# Patient Record
Sex: Female | Born: 1990 | Race: White | Hispanic: No | Marital: Single | Smoking: Former smoker
Health system: Southern US, Community
[De-identification: ages and names within clinical notes are randomized; demographics above are authoritative.]

## PROBLEM LIST (undated history)

## (undated) DIAGNOSIS — N730 Acute parametritis and pelvic cellulitis: Secondary | ICD-10-CM

## (undated) DIAGNOSIS — Z22322 Carrier or suspected carrier of Methicillin resistant Staphylococcus aureus: Secondary | ICD-10-CM

## (undated) DIAGNOSIS — N83209 Unspecified ovarian cyst, unspecified side: Secondary | ICD-10-CM

## (undated) DIAGNOSIS — R102 Pelvic and perineal pain unspecified side: Secondary | ICD-10-CM

## (undated) DIAGNOSIS — F1110 Opioid abuse, uncomplicated: Secondary | ICD-10-CM

## (undated) DIAGNOSIS — G43909 Migraine, unspecified, not intractable, without status migrainosus: Secondary | ICD-10-CM

## (undated) DIAGNOSIS — R569 Unspecified convulsions: Secondary | ICD-10-CM

## (undated) DIAGNOSIS — N200 Calculus of kidney: Secondary | ICD-10-CM

## (undated) DIAGNOSIS — R161 Splenomegaly, not elsewhere classified: Secondary | ICD-10-CM

## (undated) DIAGNOSIS — G8929 Other chronic pain: Secondary | ICD-10-CM

---
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
## 2008-02-17 ENCOUNTER — Emergency Department (HOSPITAL_COMMUNITY): Admission: EM | Admit: 2008-02-17 | Discharge: 2008-02-17 | Payer: Self-pay | Admitting: Emergency Medicine

## 2008-09-10 ENCOUNTER — Emergency Department (HOSPITAL_COMMUNITY): Admission: EM | Admit: 2008-09-10 | Discharge: 2008-09-10 | Payer: Self-pay | Admitting: Emergency Medicine

## 2008-09-13 ENCOUNTER — Emergency Department (HOSPITAL_COMMUNITY): Admission: EM | Admit: 2008-09-13 | Discharge: 2008-09-13 | Payer: Self-pay | Admitting: Emergency Medicine

## 2008-10-07 ENCOUNTER — Emergency Department (HOSPITAL_COMMUNITY): Admission: EM | Admit: 2008-10-07 | Discharge: 2008-10-07 | Payer: Self-pay | Admitting: Emergency Medicine

## 2009-02-23 ENCOUNTER — Emergency Department (HOSPITAL_COMMUNITY): Admission: EM | Admit: 2009-02-23 | Discharge: 2009-02-23 | Payer: Self-pay | Admitting: Emergency Medicine

## 2009-05-08 ENCOUNTER — Emergency Department (HOSPITAL_COMMUNITY): Admission: EM | Admit: 2009-05-08 | Discharge: 2009-05-08 | Payer: Self-pay | Admitting: Emergency Medicine

## 2009-09-22 ENCOUNTER — Emergency Department (HOSPITAL_COMMUNITY): Admission: EM | Admit: 2009-09-22 | Discharge: 2009-09-22 | Payer: Self-pay | Admitting: Emergency Medicine

## 2009-10-12 ENCOUNTER — Ambulatory Visit (HOSPITAL_COMMUNITY): Admission: RE | Admit: 2009-10-12 | Discharge: 2009-10-12 | Payer: Self-pay | Admitting: General Surgery

## 2009-11-30 ENCOUNTER — Emergency Department (HOSPITAL_COMMUNITY)
Admission: EM | Admit: 2009-11-30 | Discharge: 2009-11-30 | Payer: Self-pay | Source: Home / Self Care | Admitting: Emergency Medicine

## 2010-01-22 ENCOUNTER — Emergency Department (HOSPITAL_COMMUNITY)
Admission: EM | Admit: 2010-01-22 | Discharge: 2010-01-22 | Payer: Self-pay | Source: Home / Self Care | Admitting: Emergency Medicine

## 2010-02-24 ENCOUNTER — Encounter: Payer: Self-pay | Admitting: Preventative Medicine

## 2010-02-25 ENCOUNTER — Encounter: Payer: Self-pay | Admitting: Neurology

## 2010-02-26 ENCOUNTER — Encounter: Payer: Self-pay | Admitting: Family Medicine

## 2010-03-19 ENCOUNTER — Emergency Department (HOSPITAL_COMMUNITY)
Admission: EM | Admit: 2010-03-19 | Discharge: 2010-03-19 | Disposition: A | Discharge status: Home / Self Care | Payer: PRIVATE HEALTH INSURANCE | Attending: Emergency Medicine | Admitting: Emergency Medicine

## 2010-03-19 DIAGNOSIS — G40909 Epilepsy, unspecified, not intractable, without status epilepticus: Secondary | ICD-10-CM | POA: Insufficient documentation

## 2010-03-19 DIAGNOSIS — F411 Generalized anxiety disorder: Secondary | ICD-10-CM | POA: Insufficient documentation

## 2010-03-19 LAB — DIFFERENTIAL
Basophils Absolute: 0 10*3/uL (ref 0.0–0.1)
Basophils Relative: 0 % (ref 0–1)
Eosinophils Absolute: 0.1 10*3/uL (ref 0.0–0.7)
Eosinophils Relative: 1 % (ref 0–5)
Lymphocytes Relative: 30 % (ref 12–46)
Lymphs Abs: 2.7 10*3/uL (ref 0.7–4.0)
Monocytes Absolute: 0.6 10*3/uL (ref 0.1–1.0)
Monocytes Relative: 7 % (ref 3–12)
Neutro Abs: 5.6 10*3/uL (ref 1.7–7.7)

## 2010-03-19 LAB — VALPROIC ACID LEVEL: Valproic Acid Lvl: <10 ug/mL — ABNORMAL LOW (ref 50.0–100.0)

## 2010-03-19 LAB — CBC
HCT: 37.9 % (ref 36.0–46.0)
Hemoglobin: 13.4 g/dL (ref 12.0–15.0)
MCHC: 35.4 g/dL (ref 30.0–36.0)
MCV: 89.4 fL (ref 78.0–100.0)
RBC: 4.24 MIL/uL (ref 3.87–5.11)
RDW: 12.2 % (ref 11.5–15.5)
WBC: 9 10*3/uL (ref 4.0–10.5)

## 2010-03-19 LAB — BASIC METABOLIC PANEL
BUN: 10 mg/dL (ref 6–23)
CO2: 26 mEq/L (ref 19–32)
Calcium: 9.7 mg/dL (ref 8.4–10.5)
Chloride: 107 mEq/L (ref 96–112)
GFR calc non Af Amer: >60 mL/min (ref >60)
Glucose, Bld: 93 mg/dL (ref 70–99)
Potassium: 3.9 mEq/L (ref 3.5–5.1)
Sodium: 138 mEq/L (ref 135–145)

## 2010-04-08 ENCOUNTER — Emergency Department (HOSPITAL_COMMUNITY)
Admission: EM | Admit: 2010-04-08 | Discharge: 2010-04-08 | Disposition: A | Discharge status: Home / Self Care | Payer: PRIVATE HEALTH INSURANCE | Attending: Emergency Medicine | Admitting: Emergency Medicine

## 2010-04-08 DIAGNOSIS — R569 Unspecified convulsions: Secondary | ICD-10-CM | POA: Insufficient documentation

## 2010-04-08 DIAGNOSIS — Z765 Malingerer [conscious simulation]: Secondary | ICD-10-CM | POA: Insufficient documentation

## 2010-04-08 DIAGNOSIS — Z79899 Other long term (current) drug therapy: Secondary | ICD-10-CM | POA: Insufficient documentation

## 2010-04-08 DIAGNOSIS — F319 Bipolar disorder, unspecified: Secondary | ICD-10-CM | POA: Insufficient documentation

## 2010-04-08 LAB — RAPID URINE DRUG SCREEN, HOSP PERFORMED
Amphetamines: NOT DETECTED
Barbiturates: NOT DETECTED
Benzodiazepines: NOT DETECTED
Cocaine: NOT DETECTED
Opiates: NOT DETECTED
Tetrahydrocannabinol: NOT DETECTED

## 2010-04-08 LAB — URINALYSIS, ROUTINE W REFLEX MICROSCOPIC
Bilirubin Urine: NEGATIVE
Hgb urine dipstick: NEGATIVE
Ketones, ur: NEGATIVE mg/dL
Nitrite: NEGATIVE
Protein, ur: NEGATIVE mg/dL
Specific Gravity, Urine: 1.02 (ref 1.005–1.030)
Urobilinogen, UA: 0.2 mg/dL (ref 0.0–1.0)

## 2010-04-08 LAB — POCT I-STAT, CHEM 8
BUN: 9 mg/dL (ref 6–23)
Calcium, Ion: 1.04 mmol/L — ABNORMAL LOW (ref 1.12–1.32)
Chloride: 108 mEq/L (ref 96–112)
Creatinine, Ser: 0.7 mg/dL (ref 0.4–1.2)
Glucose, Bld: 90 mg/dL (ref 70–99)
HCT: 42 % (ref 36.0–46.0)
Potassium: 4.2 mEq/L (ref 3.5–5.1)

## 2010-04-08 LAB — ETHANOL: Alcohol, Ethyl (B): <5 mg/dL (ref 0–10)

## 2010-04-19 LAB — BASIC METABOLIC PANEL
BUN: 8 mg/dL (ref 6–23)
CO2: 28 mEq/L (ref 19–32)
Calcium: 10 mg/dL (ref 8.4–10.5)
Chloride: 101 mEq/L (ref 96–112)
Creatinine, Ser: 0.57 mg/dL (ref 0.4–1.2)
GFR calc Af Amer: >60 mL/min (ref >60)
GFR calc non Af Amer: >60 mL/min (ref >60)
Glucose, Bld: 83 mg/dL (ref 70–99)
Potassium: 4.1 mEq/L (ref 3.5–5.1)
Sodium: 137 mEq/L (ref 135–145)

## 2010-04-19 LAB — CBC
HCT: 42.3 % (ref 36.0–46.0)
Hemoglobin: 14.4 g/dL (ref 12.0–15.0)
MCH: 31.3 pg (ref 26.0–34.0)
MCHC: 34.2 g/dL (ref 30.0–36.0)
MCV: 91.7 fL (ref 78.0–100.0)
Platelets: 296 10*3/uL (ref 150–400)
RBC: 4.61 MIL/uL (ref 3.87–5.11)
RDW: 13.1 % (ref 11.5–15.5)
WBC: 10 10*3/uL (ref 4.0–10.5)

## 2010-04-19 LAB — SURGICAL PCR SCREEN: Staphylococcus aureus: POSITIVE — AB

## 2010-04-19 LAB — HCG, QUANTITATIVE, PREGNANCY: hCG, Beta Chain, Quant, S: <2 m[IU]/mL (ref <5)

## 2010-04-25 LAB — DIFFERENTIAL
Basophils Absolute: 0 10*3/uL (ref 0.0–0.1)
Basophils Relative: 0 % (ref 0–1)
Eosinophils Absolute: 0.1 10*3/uL (ref 0.0–0.7)
Monocytes Absolute: 0.5 10*3/uL (ref 0.1–1.0)
Neutro Abs: 6.7 10*3/uL (ref 1.7–7.7)

## 2010-04-25 LAB — COMPREHENSIVE METABOLIC PANEL
ALT: 18 U/L (ref 0–35)
Albumin: 4 g/dL (ref 3.5–5.2)
Alkaline Phosphatase: 75 U/L (ref 39–117)
BUN: 8 mg/dL (ref 6–23)
Chloride: 104 mEq/L (ref 96–112)
Glucose, Bld: 100 mg/dL — ABNORMAL HIGH (ref 70–99)
Potassium: 3 mEq/L — ABNORMAL LOW (ref 3.5–5.1)
Sodium: 139 mEq/L (ref 135–145)
Total Bilirubin: 0.8 mg/dL (ref 0.3–1.2)

## 2010-04-25 LAB — URINALYSIS, ROUTINE W REFLEX MICROSCOPIC
Nitrite: NEGATIVE
Specific Gravity, Urine: 1.015 (ref 1.005–1.030)
Urobilinogen, UA: 0.2 mg/dL (ref 0.0–1.0)

## 2010-04-25 LAB — CBC
HCT: 41.2 % (ref 36.0–46.0)
Hemoglobin: 14.4 g/dL (ref 12.0–15.0)
Platelets: 257 10*3/uL (ref 150–400)
WBC: 9.3 10*3/uL (ref 4.0–10.5)

## 2010-04-25 LAB — RAPID URINE DRUG SCREEN, HOSP PERFORMED
Barbiturates: NOT DETECTED
Opiates: NOT DETECTED

## 2010-04-25 LAB — ETHANOL: Alcohol, Ethyl (B): <5 mg/dL (ref 0–10)

## 2010-04-25 LAB — VALPROIC ACID LEVEL: Valproic Acid Lvl: 23 ug/mL — ABNORMAL LOW (ref 50.0–100.0)

## 2010-05-11 LAB — URINALYSIS, ROUTINE W REFLEX MICROSCOPIC
Glucose, UA: NEGATIVE mg/dL
Hgb urine dipstick: NEGATIVE
Protein, ur: NEGATIVE mg/dL
Specific Gravity, Urine: 1.01 (ref 1.005–1.030)
pH: 7 (ref 5.0–8.0)

## 2010-05-12 LAB — URINE MICROSCOPIC-ADD ON

## 2010-05-12 LAB — URINALYSIS, ROUTINE W REFLEX MICROSCOPIC
Glucose, UA: NEGATIVE mg/dL
Glucose, UA: NEGATIVE mg/dL
Hgb urine dipstick: NEGATIVE
Leukocytes, UA: NEGATIVE
Nitrite: NEGATIVE
Specific Gravity, Urine: >1.03 — ABNORMAL HIGH (ref 1.005–1.030)
pH: 5.5 (ref 5.0–8.0)
pH: 6 (ref 5.0–8.0)

## 2010-05-12 LAB — DIFFERENTIAL
Basophils Relative: 0 % (ref 0–1)
Eosinophils Absolute: 0.2 10*3/uL (ref 0.0–0.7)
Eosinophils Relative: 2 % (ref 0–5)
Eosinophils Relative: 2 % (ref 0–5)
Lymphocytes Relative: 20 % (ref 12–46)
Lymphocytes Relative: 24 % (ref 12–46)
Lymphs Abs: 2.2 10*3/uL (ref 0.7–4.0)
Monocytes Relative: 5 % (ref 3–12)
Monocytes Relative: 8 % (ref 3–12)
Neutro Abs: 4.4 10*3/uL (ref 1.7–7.7)
Neutrophils Relative %: 69 % (ref 43–77)

## 2010-05-12 LAB — COMPREHENSIVE METABOLIC PANEL
ALT: 12 U/L (ref 0–35)
AST: 16 U/L (ref 0–37)
Albumin: 3.9 g/dL (ref 3.5–5.2)
Alkaline Phosphatase: 55 U/L (ref 39–117)
BUN: 8 mg/dL (ref 6–23)
GFR calc Af Amer: >60 mL/min (ref >60)
Potassium: 3.5 mEq/L (ref 3.5–5.1)
Sodium: 140 mEq/L (ref 135–145)
Total Protein: 6.8 g/dL (ref 6.0–8.3)

## 2010-05-12 LAB — CBC
HCT: 40.1 % (ref 36.0–46.0)
MCV: 89.6 fL (ref 78.0–100.0)
Platelets: 184 10*3/uL (ref 150–400)
RBC: 4.48 MIL/uL (ref 3.87–5.11)
RDW: 12.6 % (ref 11.5–15.5)
WBC: 6.2 10*3/uL (ref 4.0–10.5)
WBC: 8.9 10*3/uL (ref 4.0–10.5)

## 2010-05-12 LAB — GC/CHLAMYDIA PROBE AMP, GENITAL: Chlamydia, DNA Probe: NEGATIVE

## 2010-05-12 LAB — POCT I-STAT, CHEM 8
BUN: 6 mg/dL (ref 6–23)
Creatinine, Ser: 0.7 mg/dL (ref 0.4–1.2)
Glucose, Bld: 87 mg/dL (ref 70–99)
Potassium: 3.6 mEq/L (ref 3.5–5.1)
Sodium: 141 mEq/L (ref 135–145)

## 2010-05-12 LAB — WET PREP, GENITAL

## 2010-05-21 LAB — URINALYSIS, ROUTINE W REFLEX MICROSCOPIC
Glucose, UA: NEGATIVE mg/dL
Leukocytes, UA: NEGATIVE
Nitrite: NEGATIVE
Specific Gravity, Urine: 1.01 (ref 1.005–1.030)
pH: >9 — ABNORMAL HIGH (ref 5.0–8.0)

## 2010-05-21 LAB — URINE MICROSCOPIC-ADD ON
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
## 2010-07-17 ENCOUNTER — Emergency Department (HOSPITAL_COMMUNITY)
Admission: EM | Admit: 2010-07-17 | Discharge: 2010-07-17 | Disposition: A | Discharge status: Home / Self Care | Payer: Medicaid Other | Attending: Emergency Medicine | Admitting: Emergency Medicine

## 2010-07-17 DIAGNOSIS — R21 Rash and other nonspecific skin eruption: Secondary | ICD-10-CM | POA: Insufficient documentation

## 2010-07-17 DIAGNOSIS — M722 Plantar fascial fibromatosis: Secondary | ICD-10-CM | POA: Insufficient documentation

## 2010-07-17 DIAGNOSIS — M79609 Pain in unspecified limb: Secondary | ICD-10-CM | POA: Insufficient documentation

## 2010-08-07 ENCOUNTER — Emergency Department (HOSPITAL_COMMUNITY)
Admission: EM | Admit: 2010-08-07 | Discharge: 2010-08-07 | Disposition: A | Discharge status: Home / Self Care | Payer: Medicaid Other | Attending: Emergency Medicine | Admitting: Emergency Medicine

## 2010-08-07 DIAGNOSIS — F17200 Nicotine dependence, unspecified, uncomplicated: Secondary | ICD-10-CM | POA: Insufficient documentation

## 2010-08-07 DIAGNOSIS — F1911 Other psychoactive substance abuse, in remission: Secondary | ICD-10-CM | POA: Insufficient documentation

## 2010-08-07 DIAGNOSIS — L0100 Impetigo, unspecified: Secondary | ICD-10-CM | POA: Insufficient documentation

## 2011-03-15 ENCOUNTER — Emergency Department (HOSPITAL_COMMUNITY): Payer: Self-pay

## 2011-03-15 ENCOUNTER — Encounter (HOSPITAL_COMMUNITY): Payer: Self-pay

## 2011-03-15 ENCOUNTER — Emergency Department (HOSPITAL_COMMUNITY)
Admission: EM | Admit: 2011-03-15 | Discharge: 2011-03-16 | Disposition: A | Discharge status: Home / Self Care | Payer: Self-pay | Attending: Emergency Medicine | Admitting: Emergency Medicine

## 2011-03-15 DIAGNOSIS — F17200 Nicotine dependence, unspecified, uncomplicated: Secondary | ICD-10-CM | POA: Insufficient documentation

## 2011-03-15 DIAGNOSIS — R350 Frequency of micturition: Secondary | ICD-10-CM | POA: Insufficient documentation

## 2011-03-15 DIAGNOSIS — R109 Unspecified abdominal pain: Secondary | ICD-10-CM | POA: Insufficient documentation

## 2011-03-15 HISTORY — DX: Migraine, unspecified, not intractable, without status migrainosus: G43.909

## 2011-03-15 LAB — URINALYSIS, ROUTINE W REFLEX MICROSCOPIC
Bilirubin Urine: NEGATIVE
Hgb urine dipstick: NEGATIVE
Ketones, ur: NEGATIVE mg/dL
Nitrite: NEGATIVE
Urobilinogen, UA: 0.2 mg/dL (ref 0.0–1.0)

## 2011-03-15 MED ORDER — HYDROMORPHONE HCL PF 1 MG/ML IJ SOLN
1.0000 mg | Freq: Once | INTRAMUSCULAR | Status: AC
  Administered 2011-03-15: 1 mg via INTRAVENOUS
  Filled 2011-03-15: qty 1

## 2011-03-15 MED ORDER — ONDANSETRON HCL 4 MG/2ML IJ SOLN
4.0000 mg | Freq: Once | INTRAMUSCULAR | Status: AC
  Administered 2011-03-15: 4 mg via INTRAVENOUS
  Filled 2011-03-15: qty 2

## 2011-03-15 MED ORDER — KETOROLAC TROMETHAMINE 30 MG/ML IJ SOLN
30.0000 mg | Freq: Once | INTRAMUSCULAR | Status: AC
  Administered 2011-03-15: 30 mg via INTRAVENOUS
  Filled 2011-03-15: qty 1

## 2011-03-15 MED ORDER — SODIUM CHLORIDE 0.9 % IV SOLN
Freq: Once | INTRAVENOUS | Status: AC
  Administered 2011-03-15: 23:00:00 via INTRAVENOUS

## 2011-03-15 NOTE — ED Notes (Signed)
Pt presents with right sided flank pain that radiates to right side of abdomen. Pt also reports increased urinary frequency.

## 2011-03-15 NOTE — ED Provider Notes (Signed)
This chart was scribed for [employer]. Colon Branch, MD by Williemae Natter. The patient was seen in room [facility]07/[facility]07 at 10:15 PM .  CSN: [id number]  Arrival date & time 03/15/11  2126   First MD Initiated Contact with Patient 03/15/11 2141      Chief Complaint  Patient presents with  . Flank Pain  . Urinary Frequency    (Consider location/radiation/quality/duration/timing/severity/associated sxs/prior treatment) Patient is a 21 y.o. female presenting with flank pain. The history is provided by the patient.  Flank Pain This is a new problem. The current episode started more than 1 week ago. The problem occurs constantly. The problem has been gradually worsening. The symptoms are aggravated by nothing. The symptoms are relieved by lying down.   [name] is a 21 y.o. female who presents to the Emergency Department complaining of severe acute onset right flank pain for the past 1 week. Pt reports urinary urgency and sharp abdominal pain that radiates to her lower back. Pt denies any fever, vomiting, chills, or blood in urine.  Past Medical History  Diagnosis Date  . Migraines     History reviewed. No pertinent past surgical history.  No family history on file.  History  Substance Use Topics  . Smoking status: Current Everyday Smoker -- 0.5 packs/day  . Smokeless tobacco: Not on file  . Alcohol Use: Yes     occ    OB History    Grav Para Term Preterm Abortions TAB SAB Ect Mult Living                  Review of Systems  Genitourinary: Positive for flank pain.  10 Systems reviewed and are negative for acute change except as noted in the HPI.   Allergies  Red dye  Home Medications   Current Outpatient Rx  Name Route Sig Dispense Refill  . BC HEADACHE POWDER PO Oral Take 1 packet by mouth 3 (three) times daily as needed. For pain      BP 123/66  Pulse 95  Temp(Src) 98.9 F (37.2 C) (Oral)  Resp 20  Ht 5\' 3"  (1.6 m)  Wt 150 lb (68.04 kg)  BMI 26.57 kg/m2  SpO2  100%  LMP 02/19/2011  Physical Exam  Nursing note and vitals reviewed. Constitutional: She appears well-developed and well-nourished.  HENT:  Head: Normocephalic and atraumatic.  Neck: Normal range of motion. Neck supple.  Cardiovascular: Normal rate, regular rhythm and normal heart sounds.   Pulmonary/Chest: Effort normal and breath sounds normal.  Abdominal: There is CVA tenderness (right CVA tenderness with palpation).  Neurological: She is alert.  Skin: Skin is warm and dry.  Psychiatric: She has a normal mood and affect. Her behavior is normal.    ED Course  Procedures (including critical care time) DIAGNOSTIC STUDIES: Oxygen Saturation is 100% on room air, normal by my interpretation.    COORDINATION OF CARE:  Medications  Aspirin-Salicylamide-Caffeine (BC HEADACHE POWDER PO) (not administered)   Results for orders placed during the hospital encounter of 03/15/11  URINALYSIS, ROUTINE W REFLEX MICROSCOPIC      Component Value Range   Color, Urine YELLOW  YELLOW    APPearance CLEAR  CLEAR    Specific Gravity, Urine 1.020  1.005 - 1.030    pH 6.0  5.0 - 8.0    Glucose, UA NEGATIVE  NEGATIVE (mg/dL)   Hgb urine dipstick NEGATIVE  NEGATIVE    Bilirubin Urine NEGATIVE  NEGATIVE    Ketones, ur NEGATIVE  NEGATIVE (mg/dL)   Protein, ur NEGATIVE  NEGATIVE (mg/dL)   Urobilinogen, UA 0.2  0.0 - 1.0 (mg/dL)   Nitrite NEGATIVE  NEGATIVE    Leukocytes, UA NEGATIVE  NEGATIVE   POCT PREGNANCY, URINE      Component Value Range   Preg Test, Ur NEGATIVE  NEGATIVE     Ct Abdomen Pelvis Wo Contrast  03/15/2011  *RADIOLOGY REPORT*  Clinical Data: Right flank pain since last night.  CT ABDOMEN AND PELVIS WITHOUT CONTRAST  Technique:  Multidetector CT imaging of the abdomen and pelvis was performed following the standard protocol without intravenous contrast.  Comparison: 09/10/2008  Findings: The lung bases are clear.  The kidneys appear symmetrical.  No pyelocaliectasis or  ureterectasis.  No renal, ureteral, or bladder stones identified. The bladder is decompressed and cannot be evaluated for wall thickness.  Unenhanced appearance of the liver, spleen, gallbladder, pancreas, adrenal glands, abdominal aorta, and retroperitoneal lymph nodes are unremarkable.  The stomach and small bowel are decompressed. Scattered stool in the colon without distension. No free air or free fluid in the abdomen.  Pelvis:  No free or loculated pelvic fluid collections.  Uterus and adnexal structures are not enlarged.  The appendix is normal.  No inflammatory changes in the sigmoid colon.  Normal alignment of lumbar vertebrae.  IMPRESSION: No renal or ureteral stone or obstruction demonstrated.  No acute process suggested in the abdomen or pelvis.  Original Report Authenticated By: Marlon Pel, M.D.       MDM  Patient with right flank pain which has worsened over three days. UA negative for blood. CT negative for kidney stone or acute process.Patient remembered after studies that she had fallen three days ago twisting her ankle. Pain to right flank is most likely musculoskeletal.Recieved Analgesics with some improvement.Pt stable in ED with no significant deterioration in condition.The patient appears reasonably screened and/or stabilized for discharge and I doubt any other medical condition or other [facility] requiring further screening, evaluation, or treatment in the ED at this time prior to discharge.   I personally performed the services described in this documentation, which was scribed in my presence. The recorded information has been reviewed and considered.  MDM Reviewed: nursing note and vitals Interpretation: labs and CT scan        Nicoletta Dress. Colon Branch, MD 03/16/11 [phone]

## 2011-03-16 MED ORDER — HYDROMORPHONE HCL PF 1 MG/ML IJ SOLN
1.0000 mg | Freq: Once | INTRAMUSCULAR | Status: AC
  Administered 2011-03-16: 1 mg via INTRAVENOUS
  Filled 2011-03-16: qty 1

## 2011-03-16 MED ORDER — HYDROCODONE-ACETAMINOPHEN 5-325 MG PO TABS: 1.0000 | ORAL_TABLET | ORAL | Status: AC | PRN

## 2011-03-16 NOTE — ED Notes (Addendum)
Patient reports right flank pain that radiates to right upper abdomen. States it started this evening.

## 2011-06-08 ENCOUNTER — Encounter (HOSPITAL_COMMUNITY): Payer: Self-pay | Admitting: *Deleted

## 2011-06-08 ENCOUNTER — Emergency Department (HOSPITAL_COMMUNITY)
Admission: EM | Admit: 2011-06-08 | Discharge: 2011-06-08 | Disposition: A | Discharge status: Home / Self Care | Payer: Self-pay | Attending: Emergency Medicine | Admitting: Emergency Medicine

## 2011-06-08 DIAGNOSIS — S39012A Strain of muscle, fascia and tendon of lower back, initial encounter: Secondary | ICD-10-CM

## 2011-06-08 DIAGNOSIS — R109 Unspecified abdominal pain: Secondary | ICD-10-CM | POA: Insufficient documentation

## 2011-06-08 DIAGNOSIS — R319 Hematuria, unspecified: Secondary | ICD-10-CM | POA: Insufficient documentation

## 2011-06-08 DIAGNOSIS — S335XXA Sprain of ligaments of lumbar spine, initial encounter: Secondary | ICD-10-CM | POA: Insufficient documentation

## 2011-06-08 DIAGNOSIS — F17200 Nicotine dependence, unspecified, uncomplicated: Secondary | ICD-10-CM | POA: Insufficient documentation

## 2011-06-08 DIAGNOSIS — K920 Hematemesis: Secondary | ICD-10-CM | POA: Insufficient documentation

## 2011-06-08 DIAGNOSIS — M549 Dorsalgia, unspecified: Secondary | ICD-10-CM | POA: Insufficient documentation

## 2011-06-08 DIAGNOSIS — X58XXXA Exposure to other specified factors, initial encounter: Secondary | ICD-10-CM | POA: Insufficient documentation

## 2011-06-08 HISTORY — DX: Calculus of kidney: N20.0

## 2011-06-08 HISTORY — DX: Unspecified convulsions: R56.9

## 2011-06-08 LAB — URINALYSIS, ROUTINE W REFLEX MICROSCOPIC
Glucose, UA: NEGATIVE mg/dL
Hgb urine dipstick: NEGATIVE
Protein, ur: NEGATIVE mg/dL
pH: 6 (ref 5.0–8.0)

## 2011-06-08 LAB — PREGNANCY, URINE: Preg Test, Ur: NEGATIVE

## 2011-06-08 MED ORDER — PHENAZOPYRIDINE HCL 100 MG PO TABS
200.0000 mg | ORAL_TABLET | Freq: Once | ORAL | Status: AC
  Administered 2011-06-08: 200 mg via ORAL
  Filled 2011-06-08: qty 2

## 2011-06-08 MED ORDER — HYDROCODONE-ACETAMINOPHEN 5-325 MG PO TABS
1.0000 | ORAL_TABLET | Freq: Once | ORAL | Status: AC
  Administered 2011-06-08: 1 via ORAL
  Filled 2011-06-08: qty 1

## 2011-06-08 MED ORDER — ONDANSETRON 4 MG PO TBDP
4.0000 mg | ORAL_TABLET | Freq: Once | ORAL | Status: AC
  Administered 2011-06-08: 4 mg via ORAL
  Filled 2011-06-08: qty 1

## 2011-06-08 MED ORDER — METHOCARBAMOL 750 MG PO TABS: ORAL_TABLET | ORAL | Status: DC

## 2011-06-08 NOTE — ED Notes (Signed)
See triage note,  

## 2011-06-08 NOTE — ED Notes (Signed)
Pt ambulatory to restroom, tolerated well.  

## 2011-06-08 NOTE — ED Provider Notes (Signed)
Medical screening examination/treatment/procedure(s) were performed by non-physician practitioner and as supervising physician I was immediately available for consultation/collaboration.   Joya Gaskins, MD 06/08/11 [phone]

## 2011-06-08 NOTE — ED Notes (Signed)
Pt c/o right flank pain, sharp in nature that started a few days ago, blood in her urine that pt noticed one time, n/v with thick white emesis with steaks of blood in it,

## 2011-06-08 NOTE — ED Notes (Signed)
Rick PA in room talking with pt and family

## 2011-06-08 NOTE — ED Provider Notes (Signed)
History     CSN: [id number]  Arrival date & time 06/08/11  6962   First MD Initiated Contact with Patient 06/08/11 [phone]      Chief Complaint  Patient presents with  . Back Pain  . Hematuria  . Hematemesis  . Flank Pain    (Consider location/radiation/quality/duration/timing/severity/associated sxs/prior treatment) HPI Comments: R posterior flank pain since last PM.  Similar to episode which brought her to ED on February of 2013.  + hematuria, frequency and urgency.  No fever or chills.  Has also vomited x 5-6 which is bilious by her description with blood streaks.    LMP ~ 1 week ago and normal. CT's of abdomen/pelvis dated 09/10/2008 and 03/15/2011 shows no evidence of renal or ureteral calculi.  Patient is a 21 y.o. female presenting with back pain, hematuria, and flank pain. The history is provided by the patient. No language interpreter was used.  Back Pain  This is a new problem. The problem occurs constantly. The problem has not changed since onset.The pain is associated with no known injury. The pain is severe. The pain is the same all the time. Pertinent negatives include no fever, no abdominal pain, no dysuria and no pelvic pain.  Hematuria Irritative symptoms include frequency. Associated symptoms include flank pain, nausea and vomiting. Pertinent negatives include no abdominal pain, dysuria or fever.  Flank Pain Associated symptoms include nausea and vomiting. Pertinent negatives include no abdominal pain or fever.    Past Medical History  Diagnosis Date  . Migraines   . Kidney stones   . Seizures     History reviewed. No pertinent past surgical history.  No family history on file.  History  Substance Use Topics  . Smoking status: Current Everyday Smoker -- 0.5 packs/day  . Smokeless tobacco: Not on file  . Alcohol Use: Yes     occ    OB History    Grav Para Term Preterm Abortions TAB SAB Ect Mult Living                  Review of Systems    Constitutional: Negative for fever.  Gastrointestinal: Positive for nausea and vomiting. Negative for abdominal pain and diarrhea.  Genitourinary: Positive for frequency, hematuria and flank pain. Negative for dysuria, menstrual problem and pelvic pain.  Musculoskeletal: Positive for back pain.  All other systems reviewed and are negative.    Allergies  Red dye  Home Medications   Current Outpatient Rx  Name Route Sig Dispense Refill  . BC HEADACHE POWDER PO Oral Take 1 packet by mouth 3 (three) times daily as needed. For pain    . METHOCARBAMOL 750 MG PO TABS  1-2 tabs po QID 40 tablet 0    BP 116/73  Pulse 75  Temp(Src) 97.7 F (36.5 C) (Oral)  Resp 18  SpO2 100%  LMP 06/01/2011  Physical Exam  Nursing note and vitals reviewed. Constitutional: She is oriented to person, place, and time. She appears well-developed and well-nourished. No distress.  HENT:  Head: Normocephalic and atraumatic.  Eyes: EOM are normal.  Neck: Normal range of motion.  Cardiovascular: Normal rate, regular rhythm and normal heart sounds.   Pulmonary/Chest: Effort normal and breath sounds normal.  Abdominal: Soft. She exhibits no distension. There is no tenderness.  Musculoskeletal: Normal range of motion.       Lumbar back: She exhibits pain. She exhibits normal range of motion, no tenderness, no bony tenderness and no swelling.  Back:  Neurological: She is alert and oriented to person, place, and time.  Skin: Skin is warm and dry.  Psychiatric: She has a normal mood and affect. Judgment normal.    ED Course  Procedures (including critical care time)   Labs Reviewed  URINALYSIS, ROUTINE W REFLEX MICROSCOPIC  PREGNANCY, URINE   No results found.   1. Lumbar strain   2. Hematemesis       MDM  Reviewed previous CT's.  U/a normal.  Reviewed findings with pt.  Also, re-examined and pt localized pain to R paravertebral muscle in lumbar area.  Pain worse with toe touching c/w  muscle strain.  Also reviewed meds she has been taking.states she takes ~ 9 BC powders daily.  Told to stop especially since notes that she has stomach ulcers. rx-robaxin 750 mg, 40.  Ice and heat to lower back.        Worthy Rancher, PA 06/08/11 1010

## 2011-08-17 ENCOUNTER — Emergency Department (HOSPITAL_COMMUNITY)
Admission: EM | Admit: 2011-08-17 | Discharge: 2011-08-17 | Disposition: A | Discharge status: Home / Self Care | Payer: Self-pay | Attending: Emergency Medicine | Admitting: Emergency Medicine

## 2011-08-17 ENCOUNTER — Encounter (HOSPITAL_COMMUNITY): Payer: Self-pay

## 2011-08-17 DIAGNOSIS — S81802A Unspecified open wound, left lower leg, initial encounter: Secondary | ICD-10-CM

## 2011-08-17 DIAGNOSIS — L989 Disorder of the skin and subcutaneous tissue, unspecified: Secondary | ICD-10-CM | POA: Insufficient documentation

## 2011-08-17 DIAGNOSIS — Z87442 Personal history of urinary calculi: Secondary | ICD-10-CM | POA: Insufficient documentation

## 2011-08-17 DIAGNOSIS — F17200 Nicotine dependence, unspecified, uncomplicated: Secondary | ICD-10-CM | POA: Insufficient documentation

## 2011-08-17 DIAGNOSIS — G43909 Migraine, unspecified, not intractable, without status migrainosus: Secondary | ICD-10-CM | POA: Insufficient documentation

## 2011-08-17 MED ORDER — DOXYCYCLINE HYCLATE 100 MG PO CAPS: 100.0000 mg | ORAL_CAPSULE | Freq: Two times a day (BID) | ORAL | Status: AC

## 2011-08-17 MED ORDER — DOXYCYCLINE HYCLATE 100 MG PO TABS
100.0000 mg | ORAL_TABLET | Freq: Once | ORAL | Status: AC
  Administered 2011-08-17: 100 mg via ORAL
  Filled 2011-08-17: qty 1

## 2011-08-17 NOTE — ED Provider Notes (Signed)
Medical screening examination/treatment/procedure(s) were performed by non-physician practitioner and as supervising physician I was immediately available for consultation/collaboration. Devoria Albe, MD, FACEP   Ward Givens, MD 08/17/11 1600

## 2011-08-17 NOTE — ED Notes (Signed)
"  i think i got mrsa" to left lower leg

## 2011-08-17 NOTE — ED Provider Notes (Signed)
History     CSN: [id number]  Arrival date & time 08/17/11  1514   First MD Initiated Contact with Patient 08/17/11 1533      Chief Complaint  Patient presents with  . Wound Check    (Consider location/radiation/quality/duration/timing/severity/associated sxs/prior treatment) HPI Comments: Pt has had a small sore on her L lower leg ~ 4-5 days.  States the scab came off.  She has "squeezed a little pus out of it".  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  No other lesions.  No fever or chills.  Patient is a 21 y.o. female presenting with wound check. The history is provided by the patient. No language interpreter was used.  Wound Check  She was treated in the ED today. Previous treatment in the ED includes oral antibiotics. Her temperature was unmeasured prior to arrival. There has been no drainage from the wound. There is no swelling present.    Past Medical History  Diagnosis Date  . Migraines   . Kidney stones   . Seizures     History reviewed. No pertinent past surgical history.  No family history on file.  History  Substance Use Topics  . Smoking status: Current Everyday Smoker -- 0.5 packs/day    Types: Cigarettes  . Smokeless tobacco: Not on file  . Alcohol Use: No     former    OB History    Grav Para Term Preterm Abortions TAB SAB Ect Mult Living                  Review of Systems  Constitutional: Negative for fever and chills.  Skin: Positive for wound. Negative for rash.  All other systems reviewed and are negative.    Allergies  Red dye  Home Medications   Current Outpatient Rx  Name Route Sig Dispense Refill  . BC HEADACHE POWDER PO Oral Take 1 packet by mouth 3 (three) times daily as needed. For pain    . METHOCARBAMOL 750 MG PO TABS  1-2 tabs po QID 40 tablet 0    BP 124/68  Pulse 75  Temp 98.1 F (36.7 C) (Oral)  Resp 18  Ht 5\' 3"  (1.6 m)  Wt 140 lb (63.504 kg)  BMI 24.80 kg/m2  SpO2 100%  LMP 08/03/2011  Physical Exam    Nursing note and vitals reviewed. Constitutional: She is oriented to person, place, and time. She appears well-developed and well-nourished. No distress.  HENT:  Head: Normocephalic and atraumatic.  Eyes: EOM are normal.  Neck: Normal range of motion.  Cardiovascular: Normal rate, regular rhythm and normal heart sounds.   Pulmonary/Chest: Effort normal and breath sounds normal.  Abdominal: Soft. She exhibits no distension. There is no tenderness.  Musculoskeletal: Normal range of motion.       Legs: Neurological: She is alert and oriented to person, place, and time.  Skin: Skin is warm and dry. No erythema.  Psychiatric: She has a normal mood and affect. Judgment normal.    ED Course  Procedures (including critical care time)  Labs Reviewed - No data to display No results found.   1. Leg wound, left       MDM  Doubt MRSA.  Pt is in close contact with 2 small children and doesn't want to spread it to them.soap and water BID/abx oint rx-doxycycline, 20 Return prn.        Evalina Field, [place] 08/17/11 1551

## 2011-08-17 NOTE — ED Notes (Signed)
Pt presents with small open wound with no drainage noted at this time. Wound covered with band-aid. Pt has past history of MRSA. Pt is upset secondary to "dr not doing anything for her". Pt requests a culture. Explained procedure and requirements.

## 2011-09-01 ENCOUNTER — Encounter (HOSPITAL_COMMUNITY): Payer: Self-pay | Admitting: *Deleted

## 2011-09-01 ENCOUNTER — Emergency Department (HOSPITAL_COMMUNITY)
Admission: EM | Admit: 2011-09-01 | Discharge: 2011-09-01 | Disposition: A | Discharge status: Home / Self Care | Payer: Self-pay | Attending: Emergency Medicine | Admitting: Emergency Medicine

## 2011-09-01 DIAGNOSIS — X500XXA Overexertion from strenuous movement or load, initial encounter: Secondary | ICD-10-CM | POA: Insufficient documentation

## 2011-09-01 DIAGNOSIS — W57XXXA Bitten or stung by nonvenomous insect and other nonvenomous arthropods, initial encounter: Secondary | ICD-10-CM | POA: Insufficient documentation

## 2011-09-01 DIAGNOSIS — S39012A Strain of muscle, fascia and tendon of lower back, initial encounter: Secondary | ICD-10-CM

## 2011-09-01 DIAGNOSIS — T148 Other injury of unspecified body region: Secondary | ICD-10-CM | POA: Insufficient documentation

## 2011-09-01 DIAGNOSIS — S335XXA Sprain of ligaments of lumbar spine, initial encounter: Secondary | ICD-10-CM | POA: Insufficient documentation

## 2011-09-01 MED ORDER — CYCLOBENZAPRINE HCL 10 MG PO TABS: 10.0000 mg | ORAL_TABLET | Freq: Three times a day (TID) | ORAL | Status: AC | PRN

## 2011-09-01 MED ORDER — HYDROCODONE-ACETAMINOPHEN 5-325 MG PO TABS: ORAL_TABLET | ORAL | Status: AC

## 2011-09-01 MED ORDER — SULFAMETHOXAZOLE-TRIMETHOPRIM 800-160 MG PO TABS: 1.0000 | ORAL_TABLET | Freq: Two times a day (BID) | ORAL | Status: AC

## 2011-09-01 NOTE — ED Notes (Signed)
Pt c/o lower back pain that started a few days ago after moving a couch C/o "lumps" to mid back area that pt first noticed "a while ago" C/o ?inscet bite to right knee this am.

## 2011-09-01 NOTE — ED Notes (Signed)
Pt discharged. Pt stable at time of discharge. Medications reviewed pt has no questions regarding discharge at this time. Pt voiced understanding of discharge instructions.  

## 2011-09-01 NOTE — ED Provider Notes (Signed)
History     CSN: [id number]  Arrival date & time 09/01/11  8295   First MD Initiated Contact with Patient 09/01/11 0914      Chief Complaint  Patient presents with  . Back Pain  . Insect Bite    (Consider location/radiation/quality/duration/timing/severity/associated sxs/prior treatment) HPI Comments: Patient c/o lower back pain for several days after moving furniture.  Also c/o insect bite to her right knee.  States the area is slightly painful to touch and itching.  Noticed a red streak to same area just PTA.  She denies recent tick bite , rash, fever or other joint pains.    Patient is a 21 y.o. female presenting with back pain. The history is provided by the patient.  Back Pain  This is a new problem. The current episode started more than 2 days ago. The problem occurs constantly. The problem has not changed since onset.The pain is associated with lifting heavy objects and twisting. The pain is present in the lumbar spine. The quality of the pain is described as aching. The pain does not radiate. The pain is moderate. The symptoms are aggravated by bending, twisting and certain positions. The pain is the same all the time. Pertinent negatives include no chest pain, no fever, no numbness, no abdominal pain, no abdominal swelling, no bowel incontinence, no perianal numbness, no bladder incontinence, no dysuria, no pelvic pain, no leg pain, no paresthesias, no paresis, no tingling and no weakness. She has tried analgesics for the symptoms. The treatment provided mild relief.    Past Medical History  Diagnosis Date  . Migraines   . Kidney stones   . Seizures     History reviewed. No pertinent past surgical history.  No family history on file.  History  Substance Use Topics  . Smoking status: Current Everyday Smoker -- 0.5 packs/day    Types: Cigarettes  . Smokeless tobacco: Not on file  . Alcohol Use: No     former    OB History    Grav Para Term Preterm Abortions TAB SAB  Ect Mult Living                  Review of Systems  Constitutional: Negative for fever.  Respiratory: Negative for shortness of breath.   Cardiovascular: Negative for chest pain.  Gastrointestinal: Negative for vomiting, abdominal pain, constipation and bowel incontinence.  Genitourinary: Negative for bladder incontinence, dysuria, hematuria, flank pain, decreased urine volume, difficulty urinating and pelvic pain.       Perineal numbness or incontinence of urine or feces  Musculoskeletal: Positive for back pain. Negative for joint swelling.  Skin: Negative for rash.       Insect bite to right knee  Neurological: Negative for tingling, weakness, numbness and paresthesias.  All other systems reviewed and are negative.    Allergies  Red dye  Home Medications   Current Outpatient Rx  Name Route Sig Dispense Refill  . BC HEADACHE POWDER PO Oral Take 1 packet by mouth 3 (three) times daily as needed. For pain    . METHOCARBAMOL 750 MG PO TABS  1-2 tabs po QID 40 tablet 0    BP 113/69  Pulse 82  Temp 98.3 F (36.8 C)  Resp 20  Ht 5\' 3"  (1.6 m)  Wt 145 lb (65.772 kg)  BMI 25.69 kg/m2  SpO2 100%  LMP 08/03/2011  Physical Exam  Nursing note and vitals reviewed. Constitutional: She is oriented to person, place, and time. She appears  well-developed and well-nourished. No distress.  HENT:  Head: Normocephalic and atraumatic.  Neck: Normal range of motion. Neck supple.  Cardiovascular: Normal rate, regular rhythm and intact distal pulses.   No murmur heard. Pulmonary/Chest: Effort normal and breath sounds normal.  Musculoskeletal: She exhibits tenderness. She exhibits no edema.       Lumbar back: She exhibits tenderness and pain. She exhibits normal range of motion, no swelling, no deformity, no laceration and normal pulse.       Back:       Legs: Neurological: She is alert and oriented to person, place, and time. No cranial nerve deficit or sensory deficit. She exhibits  normal muscle tone. Coordination and gait normal.  Reflex Scores:      Patellar reflexes are 2+ on the right side and 2+ on the left side.      Achilles reflexes are 2+ on the right side and 2+ on the left side. Skin: Skin is warm and dry.       Localized area of erythema to the right patella.  No effusion.  Pt has full ROM of the knee joint.  Small single, red streak from the area.      ED Course  Procedures (including critical care time)  Labs Reviewed - No data to display      MDM    Patient has ttp of the left lumbar paraspinal muscles.  No spinal tenderness.  Two 2 cm firm, palpable nodules to the left lumbar paraspinal muscles.   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  No evidence to suggest abscess.  No focal neuro deficits on exam.  Ambulates with a steady gait.     Pt also has a <2 cm slightly erythematous lesion to the right knee overlying the patella.  Has a 3 cm single red streak form the lesion extending toward the medial aspect of the knee.  No drainage, induration or fluctuance present.    Possible insect bite to the knee. Patient agrees to apply over-the-counter hydrocortisone cream I will prescribe antibiotics, muscle relaxer and pain medication for her back. Back pain is likely related to a lumbar strain. I doubt infectious or neurological process. She agrees to followup with her primary care physician or to return here if her symptoms worsen.   The patient appears reasonably screened and/or stabilized for discharge and I doubt any other medical condition or other [facility] requiring further screening, evaluation, or treatment in the ED at this time prior to discharge.   Prescribed:  norco #15 Flexeril Septra DS   Jamarri Vuncannon L. [place], [place] 09/06/11 2228

## 2011-09-11 NOTE — ED Provider Notes (Signed)
Medical screening examination/treatment/procedure(s) were performed by non-physician practitioner and as supervising physician I was immediately available for consultation/collaboration.  [name], MD 09/11/11 1505

## 2011-11-12 ENCOUNTER — Encounter (HOSPITAL_COMMUNITY): Payer: Self-pay | Admitting: *Deleted

## 2011-11-12 ENCOUNTER — Emergency Department (HOSPITAL_COMMUNITY)
Admission: EM | Admit: 2011-11-12 | Discharge: 2011-11-12 | Disposition: A | Discharge status: Home / Self Care | Payer: Self-pay | Attending: Emergency Medicine | Admitting: Emergency Medicine

## 2011-11-12 DIAGNOSIS — K029 Dental caries, unspecified: Secondary | ICD-10-CM | POA: Insufficient documentation

## 2011-11-12 DIAGNOSIS — Z87891 Personal history of nicotine dependence: Secondary | ICD-10-CM | POA: Insufficient documentation

## 2011-11-12 MED ORDER — HYDROCODONE-ACETAMINOPHEN 5-325 MG PO TABS: 1.0000 | ORAL_TABLET | ORAL | Status: DC | PRN

## 2011-11-12 NOTE — ED Notes (Signed)
Pain lt upper molar, x 2 mos, worse today

## 2011-11-13 NOTE — ED Provider Notes (Signed)
History     CSN: [id number]  Arrival date & time 11/12/11  1350   First MD Initiated Contact with Patient 11/12/11 1539      Chief Complaint  Patient presents with  . Dental Pain    (Consider location/radiation/quality/duration/timing/severity/associated sxs/prior treatment) HPI Comments: [name]  presents with a 1 day history of dental pain and gingival swelling.   The patient has a history of injury to the tooth involved which has recently started to cause pain.  There has been no fevers,  Chills, nausea or vomiting, also no complaint of difficulty swallowing,  Although chewing makes pain worse.  The patient has tried [facility] powders without relief of symptoms.  She reports she has been on antibiotics, 2 rounds of amoxil in the past 2 months for this tooth, finished a course last week.    The history is provided by the patient.    Past Medical History  Diagnosis Date  . Migraines   . Seizures   . Kidney stones     History reviewed. No pertinent past surgical history.  History reviewed. No pertinent family history.  History  Substance Use Topics  . Smoking status: Former Smoker -- 0.5 packs/day    Types: Cigarettes    Quit date: 08/12/2011  . Smokeless tobacco: Not on file  . Alcohol Use: No     former    OB History    Grav Para Term Preterm Abortions TAB SAB Ect Mult Living                  Review of Systems  Constitutional: Negative for fever.  HENT: Positive for dental problem. Negative for sore throat, facial swelling, neck pain and neck stiffness.   Respiratory: Negative for shortness of breath.     Allergies  Monosodium glutamate; Red dye; and Tramadol  Home Medications   Current Outpatient Rx  Name Route Sig Dispense Refill  . BC HEADACHE POWDER PO Oral Take 1 packet by mouth 3 (three) times daily as needed. For pain    . HYDROCODONE-ACETAMINOPHEN 5-325 MG PO TABS Oral Take 1 tablet by mouth every 4 (four) hours as needed for pain. 15 tablet  0  . METHOCARBAMOL 750 MG PO TABS  1-2 tabs po QID 40 tablet 0    BP 118/69  Pulse 86  Temp 98 F (36.7 C) (Oral)  Resp 20  Ht 5\' 3"  (1.6 m)  Wt 150 lb (68.04 kg)  BMI 26.57 kg/m2  SpO2 100%  LMP 11/05/2011  Physical Exam  Constitutional: She is oriented to person, place, and time. She appears well-developed and well-nourished. No distress.  HENT:  Head: Normocephalic and atraumatic.  Right Ear: Tympanic membrane and external ear normal.  Left Ear: Tympanic membrane and external ear normal.  Mouth/Throat: Oropharynx is clear and moist and mucous membranes are normal. No oral lesions. Dental abscesses present.    Eyes: Conjunctivae normal are normal.  Neck: Normal range of motion. Neck supple.  Cardiovascular: Normal rate and normal heart sounds.   Pulmonary/Chest: Effort normal.  Abdominal: She exhibits no distension.  Musculoskeletal: Normal range of motion.  Lymphadenopathy:    She has no cervical adenopathy.  Neurological: She is alert and oriented to person, place, and time.  Skin: Skin is warm and dry. No erythema.  Psychiatric: She has a normal mood and affect.    ED Course  Procedures (including critical care time)  Labs Reviewed - No data to display No results found.  1. Dental cavity       MDM  No evidence of infection in this tooth at this time.  Prescribed hydrocodone.  Dental referrals given.        Burgess Amor, [place] 11/13/11 2252

## 2011-11-14 NOTE — ED Provider Notes (Signed)
Medical screening examination/treatment/procedure(s) were performed by non-physician practitioner and as supervising physician I was immediately available for consultation/collaboration.  Raeford Razor, MD 11/14/11 [phone]

## 2011-11-16 ENCOUNTER — Encounter (HOSPITAL_COMMUNITY): Payer: Self-pay | Admitting: *Deleted

## 2011-11-16 ENCOUNTER — Emergency Department (HOSPITAL_COMMUNITY)
Admission: EM | Admit: 2011-11-16 | Discharge: 2011-11-16 | Disposition: A | Discharge status: Home / Self Care | Payer: Self-pay | Attending: Emergency Medicine | Admitting: Emergency Medicine

## 2011-11-16 DIAGNOSIS — N949 Unspecified condition associated with female genital organs and menstrual cycle: Secondary | ICD-10-CM | POA: Insufficient documentation

## 2011-11-16 DIAGNOSIS — R102 Pelvic and perineal pain: Secondary | ICD-10-CM

## 2011-11-16 DIAGNOSIS — N390 Urinary tract infection, site not specified: Secondary | ICD-10-CM

## 2011-11-16 DIAGNOSIS — Z87891 Personal history of nicotine dependence: Secondary | ICD-10-CM | POA: Insufficient documentation

## 2011-11-16 HISTORY — DX: Acute parametritis and pelvic cellulitis: N73.0

## 2011-11-16 LAB — URINALYSIS, ROUTINE W REFLEX MICROSCOPIC
Ketones, ur: NEGATIVE mg/dL
Nitrite: NEGATIVE
Protein, ur: NEGATIVE mg/dL
Urobilinogen, UA: 0.2 mg/dL (ref 0.0–1.0)

## 2011-11-16 LAB — PREGNANCY, URINE: Preg Test, Ur: NEGATIVE

## 2011-11-16 LAB — URINE MICROSCOPIC-ADD ON

## 2011-11-16 MED ORDER — DOXYCYCLINE HYCLATE 100 MG PO TABS
100.0000 mg | ORAL_TABLET | Freq: Once | ORAL | Status: AC
  Administered 2011-11-16: 100 mg via ORAL
  Filled 2011-11-16: qty 1

## 2011-11-16 MED ORDER — DOXYCYCLINE HYCLATE 100 MG PO CAPS: 100.0000 mg | ORAL_CAPSULE | Freq: Two times a day (BID) | ORAL | Status: DC

## 2011-11-16 MED ORDER — METRONIDAZOLE 500 MG PO TABS: 500.0000 mg | ORAL_TABLET | Freq: Two times a day (BID) | ORAL | Status: DC

## 2011-11-16 MED ORDER — METRONIDAZOLE 500 MG PO TABS
500.0000 mg | ORAL_TABLET | Freq: Once | ORAL | Status: AC
  Administered 2011-11-16: 500 mg via ORAL
  Filled 2011-11-16: qty 1

## 2011-11-16 MED ORDER — LIDOCAINE HCL (PF) 1 % IJ SOLN
INTRAMUSCULAR | Status: AC
  Administered 2011-11-16: 2.2 mL
  Filled 2011-11-16: qty 5

## 2011-11-16 MED ORDER — CEFTRIAXONE SODIUM 250 MG IJ SOLR
250.0000 mg | Freq: Once | INTRAMUSCULAR | Status: AC
  Administered 2011-11-16: 250 mg via INTRAMUSCULAR
  Filled 2011-11-16: qty 250

## 2011-11-16 MED ORDER — OXYCODONE-ACETAMINOPHEN 5-325 MG PO TABS: 1.0000 | ORAL_TABLET | Freq: Four times a day (QID) | ORAL | Status: DC | PRN

## 2011-11-16 MED ORDER — HYDROCODONE-ACETAMINOPHEN 5-325 MG PO TABS
2.0000 | ORAL_TABLET | Freq: Once | ORAL | Status: AC
  Administered 2011-11-16: 2 via ORAL
  Filled 2011-11-16: qty 2

## 2011-11-16 NOTE — ED Notes (Signed)
Pt with green vaginal discharge x 1 month, unable to insert even smallest tampon during her menses, unable to urinate-last since 0500 this morning, hx of PID

## 2011-11-16 NOTE — ED Notes (Signed)
In to assist Dr. Adriana Simas with pelvic exam and pt was unable to tolerate Dr. Adriana Simas inserting finger to put in speculum. Pt screamed for him to stop. MD stopped pelvic.

## 2011-11-16 NOTE — ED Provider Notes (Addendum)
History   This chart was scribed for Leah Hutching, MD scribed by Magnus Sinning. The patient was seen in room [facility]08 at 18:06   CSN: [id number]  Arrival date & time 11/16/11  1710     Chief Complaint  Patient presents with  . Vaginal Pain    (Consider location/radiation/quality/duration/timing/severity/associated sxs/prior treatment) The history is provided by the patient. No language interpreter was used.  [name] is a 21 y.o. female who presents to the Emergency Department complaining of constant moderate suprapubic pain, onset intermittent one month, but constant one week with associated green-colored vaginal discharge, increased frequency, urinary retention, and mild back pain. Patient reports that two years ago she noticed pain with tampon insertion. She explains she was evaluated and informed it was PID. She says she was treated with abx with improvement. However, she says approximately one month ago she began having pelvic pain. She describes having intially clear discharge, but she says it later appeared "neon-green colored." She says she began taking old prescriptions for former dental infections, septra and amoxil, to treat vaginal sxs. Approximately, two weeks ago she attempted to use a tampon again during menses (LNMP: 11/05/2011) and says she was unable to insert into vagina due to pain. She says once her menses ended, the green colored discharge returned. She says associated pelvic pain began one week ago.  She denies hx of pregnancy, or use of oral contraceptives. Pt is sexually active, but has not had sex with a female partner recently.  Past Medical History  Diagnosis Date  . Migraines   . Seizures   . PID (acute pelvic inflammatory disease)   . Kidney stones     History reviewed. No pertinent past surgical history.  History reviewed. No pertinent family history.  History  Substance Use Topics  . Smoking status: Former Smoker -- 0.5 packs/day    Types:  Cigarettes    Quit date: 08/12/2011  . Smokeless tobacco: Not on file  . Alcohol Use: No     former    Review of Systems  All other systems reviewed and are negative.   10 Systems reviewed and are negative for acute change except as noted in the HPI. Allergies  Monosodium glutamate; Red dye; and Tramadol  Home Medications   Current Outpatient Rx  Name Route Sig Dispense Refill  . BC HEADACHE POWDER PO Oral Take 1 packet by mouth 3 (three) times daily as needed. For pain    . HYDROCODONE-ACETAMINOPHEN 5-325 MG PO TABS Oral Take 1 tablet by mouth every 4 (four) hours as needed for pain. 15 tablet 0  . METHOCARBAMOL 750 MG PO TABS  1-2 tabs po QID 40 tablet 0    BP 119/76  Pulse 73  Temp 98.3 F (36.8 C) (Oral)  Resp 18  Ht 5\' 2"  (1.575 m)  Wt 140 lb (63.504 kg)  BMI 25.61 kg/m2  SpO2 100%  LMP 11/05/2011  Physical Exam  Nursing note and vitals reviewed. Constitutional: She is oriented to person, place, and time. She appears well-developed and well-nourished.  HENT:  Head: Normocephalic and atraumatic.  Eyes: Conjunctivae normal and EOM are normal. Pupils are equal, round, and reactive to light.  Neck: Normal range of motion. Neck supple.  Cardiovascular: Normal rate, regular rhythm and normal heart sounds.   Pulmonary/Chest: Effort normal and breath sounds normal.  Abdominal: Soft. Bowel sounds are normal. There is tenderness.       Tender at suprapubic area  Genitourinary:  Chaperone present.  Introitus appears normal. Pelvic exam terminated approximately 3 cm into the vagina secondary to pain.  Musculoskeletal: Normal range of motion.  Neurological: She is alert and oriented to person, place, and time.  Skin: Skin is warm and dry.  Psychiatric: She has a normal mood and affect.    ED Course  Procedures (including critical care time) DIAGNOSTIC STUDIES: Oxygen Saturation is 100% on room air, normal by my interpretation.    COORDINATION OF CARE: 18:10:  Patient informed of intent to conduct nurse-chaperoned pelvic examination and orders for gc /chlamydia, and wet prep. Patient and family agreeable.    Labs Reviewed  URINALYSIS, ROUTINE W REFLEX MICROSCOPIC  PREGNANCY, URINE  WET PREP, GENITAL  GC/CHLAMYDIA PROBE AMP, GENITAL    Urinalysis shows moderate leukocytes and esterase, 11-20 white cells, 7-10 red cells, many bacteria No results found.   No diagnosis found.    MDM   Difficult clinical scenario secondary to inability to perform pelvic exam. Patient claims green vaginal discharge.  She claims to have sexual relations only with a woman. She  has generalized midline pelvic pain. Rocephin 250 mg IM, doxycycline by mouth, Flagyl by mouth[each for 14 days].   return Sunday morning for ultrasound to rule out tubo-ovarian abscess. Also prescription for Percocet #15     I personally performed the services described in this documentation, which was scribed in my presence. The recorded information has been reviewed and considered.         Leah Hutching, MD 11/16/11 1191  Leah Hutching, MD 11/16/11 2038

## 2011-11-17 ENCOUNTER — Ambulatory Visit (HOSPITAL_COMMUNITY)
Admit: 2011-11-17 | Discharge: 2011-11-17 | Disposition: A | Discharge status: Home / Self Care | Payer: Self-pay | Attending: Emergency Medicine | Admitting: Emergency Medicine

## 2011-11-17 ENCOUNTER — Other Ambulatory Visit (HOSPITAL_COMMUNITY): Payer: Self-pay | Admitting: Emergency Medicine

## 2011-11-17 DIAGNOSIS — R52 Pain, unspecified: Secondary | ICD-10-CM

## 2011-11-17 DIAGNOSIS — N949 Unspecified condition associated with female genital organs and menstrual cycle: Secondary | ICD-10-CM | POA: Insufficient documentation

## 2011-11-17 DIAGNOSIS — N83209 Unspecified ovarian cyst, unspecified side: Secondary | ICD-10-CM | POA: Insufficient documentation

## 2011-11-19 ENCOUNTER — Encounter (HOSPITAL_COMMUNITY): Payer: Self-pay | Admitting: *Deleted

## 2011-11-19 ENCOUNTER — Emergency Department (HOSPITAL_COMMUNITY)
Admission: EM | Admit: 2011-11-19 | Discharge: 2011-11-20 | Disposition: A | Discharge status: Home / Self Care | Payer: Self-pay | Attending: Emergency Medicine | Admitting: Emergency Medicine

## 2011-11-19 ENCOUNTER — Emergency Department (HOSPITAL_COMMUNITY): Payer: Self-pay

## 2011-11-19 DIAGNOSIS — R109 Unspecified abdominal pain: Secondary | ICD-10-CM | POA: Insufficient documentation

## 2011-11-19 DIAGNOSIS — N83209 Unspecified ovarian cyst, unspecified side: Secondary | ICD-10-CM | POA: Insufficient documentation

## 2011-11-19 DIAGNOSIS — N83201 Unspecified ovarian cyst, right side: Secondary | ICD-10-CM

## 2011-11-19 MED ORDER — ONDANSETRON HCL 4 MG/2ML IJ SOLN
4.0000 mg | Freq: Once | INTRAMUSCULAR | Status: AC
  Administered 2011-11-19: 4 mg via INTRAVENOUS
  Filled 2011-11-19: qty 2

## 2011-11-19 MED ORDER — HYDROMORPHONE HCL PF 1 MG/ML IJ SOLN
0.5000 mg | Freq: Once | INTRAMUSCULAR | Status: AC
  Administered 2011-11-19: 0.5 mg via INTRAVENOUS
  Filled 2011-11-19: qty 1

## 2011-11-19 NOTE — ED Notes (Signed)
Low abdominal pain 

## 2011-11-19 NOTE — ED Provider Notes (Signed)
History     CSN: [id number]  Arrival date & time 11/19/11  2153   First MD Initiated Contact with Patient 11/19/11 2256      Chief Complaint  Patient presents with  . Abdominal Pain    (Consider location/radiation/quality/duration/timing/severity/associated sxs/prior treatment) HPI Comments: Pt was seen in the ED on 10/12 for lower abd pain and green discharge from the vagina. Lab revealed UTI. Pt had ultrasound the following day. She was noted to have 2 cyst on the right ovary. No other problem. Pt would not allow transvaginal evaluation. She presents tonight stating the pain never went away. No fever. Nausea present today. No constipation or diarrhea. No recent trauma to the lower abd. Pt states she has not had sexual relations with a man. She states the vaginal discharge has resolved. She request evaluation of the lower abd pain.  Patient is a 21 y.o. female presenting with abdominal pain. The history is provided by the patient.  Abdominal Pain The primary symptoms of the illness include abdominal pain, vomiting and vaginal discharge. The primary symptoms of the illness do not include shortness of breath, hematemesis, hematochezia or dysuria. Primary symptoms comment: lower abd. The current episode started more than 2 days ago. The onset of the illness was gradual. The problem has been gradually worsening.  The vaginal discharge is not associated with dysuria.  The patient states that she believes she is currently not pregnant. Additional symptoms associated with the illness include back pain. Symptoms associated with the illness do not include anorexia, constipation, hematuria or frequency. Significant associated medical issues do not include diverticulitis, HIV or cardiac disease.    Past Medical History  Diagnosis Date  . Migraines   . Seizures   . PID (acute pelvic inflammatory disease)   . Kidney stones     History reviewed. No pertinent past surgical history.  No family  history on file.  History  Substance Use Topics  . Smoking status: Former Smoker -- 0.5 packs/day    Types: Cigarettes    Quit date: 08/12/2011  . Smokeless tobacco: Not on file  . Alcohol Use: No     former    OB History    Grav Para Term Preterm Abortions TAB SAB Ect Mult Living                  Review of Systems  Constitutional: Negative for activity change.       All ROS Neg except as noted in HPI  HENT: Negative for nosebleeds and neck pain.   Eyes: Negative for photophobia and discharge.  Respiratory: Negative for cough, shortness of breath and wheezing.   Cardiovascular: Negative for chest pain and palpitations.  Gastrointestinal: Positive for vomiting and abdominal pain. Negative for constipation, blood in stool, hematochezia, anorexia and hematemesis.  Genitourinary: Positive for vaginal discharge and vaginal pain. Negative for dysuria, frequency and hematuria.  Musculoskeletal: Positive for back pain. Negative for arthralgias.  Skin: Negative.   Neurological: Negative for dizziness, seizures and speech difficulty.  Psychiatric/Behavioral: Negative for hallucinations and confusion.    Allergies  Monosodium glutamate; Red dye; and Tramadol  Home Medications   Current Outpatient Rx  Name Route Sig Dispense Refill  . AMOXICILLIN 500 MG PO CAPS Oral Take 500 mg by mouth 3 (three) times daily.    . BC HEADACHE POWDER PO Oral Take 1 packet by mouth 3 (three) times daily as needed. For pain    . DOXYCYCLINE HYCLATE 100 MG PO CAPS Oral Take  1 capsule (100 mg total) by mouth 2 (two) times daily. 28 capsule 0  . METRONIDAZOLE 500 MG PO TABS Oral Take 1 tablet (500 mg total) by mouth 2 (two) times daily. One po bid x 7 days 28 tablet 0  . OXYCODONE-ACETAMINOPHEN 5-325 MG PO TABS Oral Take 1-2 tablets by mouth every 6 (six) hours as needed for pain. 15 tablet 0    BP 112/74  Pulse 85  Temp 97.4 F (36.3 C) (Oral)  Resp 20  Ht 5\' 3"  (1.6 m)  Wt 140 lb (63.504 kg)   BMI 24.80 kg/m2  SpO2 99%  LMP 11/05/2011  Physical Exam  Nursing note and vitals reviewed. Constitutional: She is oriented to person, place, and time. She appears well-developed and well-nourished.  Non-toxic appearance.  HENT:  Head: Normocephalic.  Right Ear: Tympanic membrane and external ear normal.  Left Ear: Tympanic membrane and external ear normal.  Eyes: EOM and lids are normal. Pupils are equal, round, and reactive to light.  Neck: Normal range of motion. Neck supple. Carotid bruit is not present.  Cardiovascular: Normal rate, regular rhythm, normal heart sounds, intact distal pulses and normal pulses.   Pulmonary/Chest: Breath sounds normal. No respiratory distress.  Abdominal: Soft. Bowel sounds are normal. There is tenderness. There is guarding.       Right and left lower abd pain. Suprapubic area pain to palpation.  Musculoskeletal: Normal range of motion.  Lymphadenopathy:       Head (right side): No submandibular adenopathy present.       Head (left side): No submandibular adenopathy present.    She has no cervical adenopathy.  Neurological: She is alert and oriented to person, place, and time. She has normal strength. No cranial nerve deficit or sensory deficit.  Skin: Skin is warm and dry.  Psychiatric: She has a normal mood and affect. Her speech is normal.    ED Course  Procedures (including critical care time)  Labs Reviewed - No data to display No results found.   No diagnosis found.    MDM  I have reviewed nursing notes, vital signs, and all appropriate lab and imaging results for this patient. 12:20am - Pt sitting [place] style in bed, talking on phone, drinking contrast. Pt in no distress. UA reveals  7-10 rbc, otherwise negative. WBC much improved from 10/12. The CT scan suggest old 1 ovarian cysts that measures 2.1 cm. It is of note that the ultrasound done on October 13 suggest to cyst present. There is some free fluid in the pelvis. 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  The findings of tonight's test compared to the testing done on October 12 were discussed with the patient. Questions were answered. It was suggested to the patient that she see her GYN physician for additional workup and evaluation concerning the cyst. Prescription for diclofenac 75 mg one twice a day and Norco one every 4 hours #15 tablets given to the patient. Patient is to return to the emergency department if any changes, problems, or concerns.       Kathie Dike, [place] 11/20/11 [phone]

## 2011-11-20 LAB — URINALYSIS, ROUTINE W REFLEX MICROSCOPIC
Bilirubin Urine: NEGATIVE
Glucose, UA: NEGATIVE mg/dL
Nitrite: NEGATIVE
Specific Gravity, Urine: 1.02 (ref 1.005–1.030)
pH: 5.5 (ref 5.0–8.0)

## 2011-11-20 LAB — URINE MICROSCOPIC-ADD ON

## 2011-11-20 MED ORDER — HYDROCODONE-ACETAMINOPHEN 5-325 MG PO TABS
1.0000 | ORAL_TABLET | Freq: Once | ORAL | Status: AC
  Administered 2011-11-20: 1 via ORAL
  Filled 2011-11-20: qty 1

## 2011-11-20 MED ORDER — DICLOFENAC SODIUM 75 MG PO TBEC: 75.0000 mg | DELAYED_RELEASE_TABLET | Freq: Two times a day (BID) | ORAL | Status: DC

## 2011-11-20 MED ORDER — PROMETHAZINE HCL 12.5 MG PO TABS
12.5000 mg | ORAL_TABLET | Freq: Once | ORAL | Status: AC
  Administered 2011-11-20: 12.5 mg via ORAL
  Filled 2011-11-20: qty 1

## 2011-11-20 MED ORDER — HYDROCODONE-ACETAMINOPHEN 5-325 MG PO TABS: 1.0000 | ORAL_TABLET | ORAL | Status: DC | PRN

## 2011-11-20 MED ORDER — IOHEXOL 300 MG/ML  SOLN
100.0000 mL | Freq: Once | INTRAMUSCULAR | Status: AC | PRN
  Administered 2011-11-20: 100 mL via INTRAVENOUS

## 2011-11-20 NOTE — ED Provider Notes (Signed)
Medical screening examination/treatment/procedure(s) were performed by non-physician practitioner and as supervising physician I was immediately available for consultation/collaboration.  Sunnie Nielsen, MD 11/20/11 [phone]

## 2011-11-23 ENCOUNTER — Emergency Department (HOSPITAL_COMMUNITY)
Admission: EM | Admit: 2011-11-23 | Discharge: 2011-11-23 | Disposition: A | Discharge status: Home / Self Care | Payer: Self-pay | Attending: Emergency Medicine | Admitting: Emergency Medicine

## 2011-11-23 ENCOUNTER — Encounter (HOSPITAL_COMMUNITY): Payer: Self-pay | Admitting: Emergency Medicine

## 2011-11-23 DIAGNOSIS — Z886 Allergy status to analgesic agent status: Secondary | ICD-10-CM | POA: Insufficient documentation

## 2011-11-23 DIAGNOSIS — Z79899 Other long term (current) drug therapy: Secondary | ICD-10-CM | POA: Insufficient documentation

## 2011-11-23 DIAGNOSIS — R102 Pelvic and perineal pain: Secondary | ICD-10-CM

## 2011-11-23 DIAGNOSIS — N949 Unspecified condition associated with female genital organs and menstrual cycle: Secondary | ICD-10-CM | POA: Insufficient documentation

## 2011-11-23 DIAGNOSIS — Z91018 Allergy to other foods: Secondary | ICD-10-CM | POA: Insufficient documentation

## 2011-11-23 DIAGNOSIS — Z87891 Personal history of nicotine dependence: Secondary | ICD-10-CM | POA: Insufficient documentation

## 2011-11-23 HISTORY — DX: Unspecified ovarian cyst, unspecified side: N83.209

## 2011-11-23 HISTORY — DX: Splenomegaly, not elsewhere classified: R16.1

## 2011-11-23 MED ORDER — ONDANSETRON 4 MG PO TBDP
4.0000 mg | ORAL_TABLET | Freq: Once | ORAL | Status: AC
  Administered 2011-11-23: 4 mg via ORAL
  Filled 2011-11-23: qty 1

## 2011-11-23 MED ORDER — OXYCODONE-ACETAMINOPHEN 5-325 MG PO TABS
1.0000 | ORAL_TABLET | Freq: Once | ORAL | Status: AC
  Administered 2011-11-23: 1 via ORAL
  Filled 2011-11-23: qty 1

## 2011-11-23 NOTE — ED Provider Notes (Signed)
History     CSN: [id number]  Arrival date & time 11/23/11  1949   First MD Initiated Contact with Patient 11/23/11 2240      Chief Complaint  Patient presents with  . Abdominal Pain    (Consider location/radiation/quality/duration/timing/severity/associated sxs/prior treatment) HPI Comments: Pt was treated ~ 1 week ago for a UTI.  She had a pelvic ultrasound  Which showed 2 right ovarian cysts but no acute problems such as TOA. She reports pelvic pain on pelvic exam but o/w normal.   A few days after that she returned and had an abd/pelvic CT  With suspicion for splenic hemangioma and suggestion of splenic enlargement.  No diverticulitis and no appendicitis.  She has been referred to dr. Emelda Fear but she has not called.  "what's the point i don't have insurance.  i am mad.  i hurt and i've been here for almost 3 hrs. And nobody can tell me why i am hurting.  You all just dope me up and send me home.  You haven't done anything for me".  Patient is a 21 y.o. female presenting with abdominal pain. The history is provided by the patient. No language interpreter was used.  Abdominal Pain The primary symptoms of the illness include abdominal pain. The primary symptoms of the illness do not include fever, nausea, vomiting, diarrhea, hematochezia, dysuria, vaginal discharge or vaginal bleeding. Episode onset: ~ 1 week ago. The onset of the illness was gradual.  The patient states that she believes she is currently not pregnant. The patient has not had a change in bowel habit. Symptoms associated with the illness do not include chills, urgency, hematuria, frequency or back pain. Significant associated medical issues do not include inflammatory bowel disease or diabetes.    Past Medical History  Diagnosis Date  . Migraines   . Seizures   . PID (acute pelvic inflammatory disease)   . Kidney stones   . Ovarian cyst   . Splenic mass     History reviewed. No pertinent past surgical  history.  History reviewed. No pertinent family history.  History  Substance Use Topics  . Smoking status: Former Smoker -- 0.5 packs/day    Types: Cigarettes    Quit date: 08/12/2011  . Smokeless tobacco: Not on file  . Alcohol Use: No     former    OB History    Grav Para Term Preterm Abortions TAB SAB Ect Mult Living                  Review of Systems  Constitutional: Negative for fever and chills.  Gastrointestinal: Positive for abdominal pain. Negative for nausea, vomiting, diarrhea and hematochezia.  Genitourinary: Positive for pelvic pain. Negative for dysuria, urgency, frequency, hematuria, vaginal bleeding, vaginal discharge and vaginal pain.  Musculoskeletal: Negative for back pain.  All other systems reviewed and are negative.    Allergies  Monosodium glutamate; Red dye; and Tramadol  Home Medications   Current Outpatient Rx  Name Route Sig Dispense Refill  . AMOXICILLIN 500 MG PO CAPS Oral Take 500 mg by mouth 3 (three) times daily.    . BC HEADACHE POWDER PO Oral Take 1 packet by mouth 3 (three) times daily as needed. For pain    . DICLOFENAC SODIUM 75 MG PO TBEC Oral Take 1 tablet (75 mg total) by mouth 2 (two) times daily. 12 tablet 0  . DOXYCYCLINE HYCLATE 100 MG PO CAPS Oral Take 1 capsule (100 mg total) by mouth 2 (  two) times daily. 28 capsule 0  . HYDROCODONE-ACETAMINOPHEN 5-325 MG PO TABS Oral Take 1 tablet by mouth every 4 (four) hours as needed for pain. 15 tablet 0  . METRONIDAZOLE 500 MG PO TABS Oral Take 1 tablet (500 mg total) by mouth 2 (two) times daily. One po bid x 7 days 28 tablet 0  . OXYCODONE-ACETAMINOPHEN 5-325 MG PO TABS Oral Take 1-2 tablets by mouth every 6 (six) hours as needed for pain. 15 tablet 0    BP 119/78  Pulse 74  Temp 98.4 F (36.9 C) (Oral)  Ht 5\' 3"  (1.6 m)  Wt 140 lb (63.504 kg)  BMI 24.80 kg/m2  SpO2 100%  LMP 11/05/2011  Physical Exam  Nursing note and vitals reviewed. Constitutional: She is oriented to  person, place, and time. She appears well-developed and well-nourished. No distress.  HENT:  Head: Normocephalic and atraumatic.  Eyes: EOM are normal.  Neck: Normal range of motion.  Cardiovascular: Normal rate, regular rhythm and normal heart sounds.   Pulmonary/Chest: Effort normal and breath sounds normal.  Abdominal: Soft. Normal appearance. She exhibits no distension. There is tenderness. There is no rebound, no guarding and no CVA tenderness.    Genitourinary: No vaginal discharge found.  Musculoskeletal: Normal range of motion.  Neurological: She is alert and oriented to person, place, and time.  Skin: Skin is warm and dry.  Psychiatric: She has a normal mood and affect. Judgment normal.    ED Course  Procedures (including critical care time)  Labs Reviewed - No data to display No results found.   1. Pelvic pain in female       MDM  Reviewed pelvic u/s and ct of ab/pelvis.  Call dr. Emelda Fear on Monday for further evaluation.        Evalina Field, PA 11/24/11 1637  Evalina Field, PA 11/24/11 [phone]

## 2011-11-23 NOTE — ED Notes (Signed)
Patient complaining of lower abdominal pain. Reports history of ovarian cysts and mass on spleen.

## 2011-11-23 NOTE — ED Notes (Signed)
Went to speak with pt regarding her complaints about being seen by Al Decant, pac.  Pt states he was rude to her and that "I may just walk out"  Offered for pt to wait to see if the 2300 doctor would see her instead and pt states "I will see"

## 2011-11-25 NOTE — ED Provider Notes (Signed)
Medical screening examination/treatment/procedure(s) were performed by non-physician practitioner and as supervising physician I was immediately available for consultation/collaboration.  Ngozi Alvidrez S. Capers Hagmann, MD 11/25/11 0528 

## 2012-01-17 ENCOUNTER — Encounter (HOSPITAL_COMMUNITY): Payer: Self-pay | Admitting: *Deleted

## 2012-01-17 ENCOUNTER — Emergency Department (HOSPITAL_COMMUNITY)
Admission: EM | Admit: 2012-01-17 | Discharge: 2012-01-18 | Disposition: A | Discharge status: Home / Self Care | Payer: Self-pay | Attending: Emergency Medicine | Admitting: Emergency Medicine

## 2012-01-17 DIAGNOSIS — K0889 Other specified disorders of teeth and supporting structures: Secondary | ICD-10-CM

## 2012-01-17 DIAGNOSIS — G43909 Migraine, unspecified, not intractable, without status migrainosus: Secondary | ICD-10-CM | POA: Insufficient documentation

## 2012-01-17 DIAGNOSIS — Z791 Long term (current) use of non-steroidal anti-inflammatories (NSAID): Secondary | ICD-10-CM | POA: Insufficient documentation

## 2012-01-17 DIAGNOSIS — Z79899 Other long term (current) drug therapy: Secondary | ICD-10-CM | POA: Insufficient documentation

## 2012-01-17 DIAGNOSIS — G40909 Epilepsy, unspecified, not intractable, without status epilepticus: Secondary | ICD-10-CM | POA: Insufficient documentation

## 2012-01-17 DIAGNOSIS — N739 Female pelvic inflammatory disease, unspecified: Secondary | ICD-10-CM | POA: Insufficient documentation

## 2012-01-17 DIAGNOSIS — Z87891 Personal history of nicotine dependence: Secondary | ICD-10-CM | POA: Insufficient documentation

## 2012-01-17 DIAGNOSIS — Z8742 Personal history of other diseases of the female genital tract: Secondary | ICD-10-CM | POA: Insufficient documentation

## 2012-01-17 DIAGNOSIS — Z87442 Personal history of urinary calculi: Secondary | ICD-10-CM | POA: Insufficient documentation

## 2012-01-17 DIAGNOSIS — K089 Disorder of teeth and supporting structures, unspecified: Secondary | ICD-10-CM | POA: Insufficient documentation

## 2012-01-17 DIAGNOSIS — D739 Disease of spleen, unspecified: Secondary | ICD-10-CM | POA: Insufficient documentation

## 2012-01-17 NOTE — ED Notes (Signed)
Pt reports abscessed tooth & a broken tooth, has not called dentist.

## 2012-01-18 MED ORDER — HYDROCODONE-ACETAMINOPHEN 5-325 MG PO TABS: 1.0000 | ORAL_TABLET | ORAL | Status: DC | PRN

## 2012-01-18 MED ORDER — KETOROLAC TROMETHAMINE 10 MG PO TABS
10.0000 mg | ORAL_TABLET | Freq: Once | ORAL | Status: AC
  Administered 2012-01-18: 10 mg via ORAL
  Filled 2012-01-18: qty 1

## 2012-01-18 MED ORDER — MELOXICAM 7.5 MG PO TABS: ORAL_TABLET | ORAL | Status: DC

## 2012-01-18 MED ORDER — HYDROCODONE-ACETAMINOPHEN 5-325 MG PO TABS
1.0000 | ORAL_TABLET | Freq: Once | ORAL | Status: AC
  Administered 2012-01-18: 1 via ORAL
  Filled 2012-01-18: qty 1

## 2012-01-18 MED ORDER — PENICILLIN V POTASSIUM 250 MG PO TABS
500.0000 mg | ORAL_TABLET | Freq: Once | ORAL | Status: AC
  Administered 2012-01-18: 500 mg via ORAL
  Filled 2012-01-18: qty 2

## 2012-01-18 MED ORDER — AMOXICILLIN 500 MG PO CAPS: 500.0000 mg | ORAL_CAPSULE | Freq: Three times a day (TID) | ORAL | Status: DC

## 2012-01-18 NOTE — ED Provider Notes (Signed)
Medical screening examination/treatment/procedure(s) were performed by non-physician practitioner and as supervising physician I was immediately available for consultation/collaboration.  Marieanne Marxen, MD 01/18/12 0328 

## 2012-01-18 NOTE — ED Provider Notes (Signed)
History     CSN: [id number]  Arrival date & time 01/17/12  2339   First MD Initiated Contact with Patient 01/18/12 0003      Chief Complaint  Patient presents with  . Dental Problem    (Consider location/radiation/quality/duration/timing/severity/associated sxs/prior treatment) Patient is a 21 y.o. female presenting with tooth pain. The history is provided by the patient.  Dental PainThe primary symptoms include mouth pain and headaches. Primary symptoms do not include shortness of breath or cough. The symptoms began 2 days ago. The symptoms are worsening. The symptoms occur constantly.  The headache is not associated with photophobia.  Additional symptoms include: gum swelling and jaw pain. Additional symptoms do not include: nosebleeds. Associated medical issues comments: migraines.    Past Medical History  Diagnosis Date  . Migraines   . Seizures   . PID (acute pelvic inflammatory disease)   . Kidney stones   . Ovarian cyst   . Splenic mass     History reviewed. No pertinent past surgical history.  No family history on file.  History  Substance Use Topics  . Smoking status: Former Smoker -- 0.5 packs/day    Types: Cigarettes    Quit date: 08/12/2011  . Smokeless tobacco: Not on file  . Alcohol Use: No     Comment: former    OB History    Grav Para Term Preterm Abortions TAB SAB Ect Mult Living                  Review of Systems  Constitutional: Negative for activity change.       All ROS Neg except as noted in HPI  HENT: Positive for dental problem. Negative for nosebleeds and neck pain.   Eyes: Negative for photophobia and discharge.  Respiratory: Negative for cough, shortness of breath and wheezing.   Cardiovascular: Negative for chest pain and palpitations.  Gastrointestinal: Negative for abdominal pain and blood in stool.  Genitourinary: Negative for dysuria, frequency and hematuria.  Musculoskeletal: Negative for back pain and arthralgias.  Skin:  Negative.   Neurological: Positive for seizures and headaches. Negative for dizziness and speech difficulty.  Psychiatric/Behavioral: Negative for hallucinations and confusion.    Allergies  Monosodium glutamate; Red dye; and Tramadol  Home Medications   Current Outpatient Rx  Name  Route  Sig  Dispense  Refill  . ACETAMINOPHEN 500 MG PO TABS   Oral   Take 1,000 mg by mouth every 6 (six) hours as needed.         . BC HEADACHE POWDER PO   Oral   Take 1 packet by mouth 3 (three) times daily as needed. For pain         . BENZOCAINE 10 % MT GEL   Mouth/Throat   Use as directed in the mouth or throat as needed.         . AMOXICILLIN 500 MG PO CAPS   Oral   Take 500 mg by mouth 3 (three) times daily.         Marland Kitchen DICLOFENAC SODIUM 75 MG PO TBEC   Oral   Take 1 tablet (75 mg total) by mouth 2 (two) times daily.   12 tablet   0   . DOXYCYCLINE HYCLATE 100 MG PO CAPS   Oral   Take 1 capsule (100 mg total) by mouth 2 (two) times daily.   28 capsule   0   . HYDROCODONE-ACETAMINOPHEN 5-325 MG PO TABS   Oral   Take  1 tablet by mouth every 4 (four) hours as needed for pain.   15 tablet   0   . METRONIDAZOLE 500 MG PO TABS   Oral   Take 1 tablet (500 mg total) by mouth 2 (two) times daily. One po bid x 7 days   28 tablet   0   . OXYCODONE-ACETAMINOPHEN 5-325 MG PO TABS   Oral   Take 1-2 tablets by mouth every 6 (six) hours as needed for pain.   15 tablet   0     BP 122/68  Pulse 90  Temp 98 F (36.7 C) (Oral)  Resp 16  Ht 5\' 3"  (1.6 m)  Wt 135 lb (61.236 kg)  BMI 23.91 kg/m2  SpO2 100%  LMP 01/02/2012  Physical Exam  Nursing note and vitals reviewed. Constitutional: She is oriented to person, place, and time. She appears well-developed and well-nourished.  Non-toxic appearance.  HENT:  Head: Normocephalic.  Right Ear: Tympanic membrane and external ear normal.  Left Ear: Tympanic membrane and external ear normal.       The right lower 2nd molar has  a cavity an a chip. The left upper 2nd molar has a deep cavity with swelling around the gum. No visible abscess noted. Airway patent. No swelling under the tongue.  Eyes: EOM and lids are normal. Pupils are equal, round, and reactive to light.  Neck: Normal range of motion. Neck supple. Carotid bruit is not present.  Cardiovascular: Normal rate, regular rhythm, normal heart sounds, intact distal pulses and normal pulses.   Pulmonary/Chest: Breath sounds normal. No respiratory distress.  Abdominal: Soft. Bowel sounds are normal. There is no tenderness. There is no guarding.  Musculoskeletal: Normal range of motion.  Lymphadenopathy:       Head (right side): No submandibular adenopathy present.       Head (left side): No submandibular adenopathy present.    She has no cervical adenopathy.  Neurological: She is alert and oriented to person, place, and time. She has normal strength. No cranial nerve deficit or sensory deficit.  Skin: Skin is warm and dry.  Psychiatric: She has a normal mood and affect. Her speech is normal.    ED Course  Procedures (including critical care time)  Labs Reviewed - No data to display No results found.   No diagnosis found.    MDM  I have reviewed nursing notes, vital signs, and all appropriate lab and imaging results for this patient.  Pt has multiple dental caries. She states her mother is going to call on Monday 12/16 to schedule an appointment with a dentist. Rx for norco, amoxil, and mobic given to the patient.      Kathie Dike, [place] 01/18/12 [place] Manners

## 2012-01-18 NOTE — ED Notes (Signed)
Pt alert & oriented x4, stable gait. Patient given discharge instructions, paperwork & prescription(s). Patient  instructed to stop at the registration desk to finish any additional paperwork. Patient verbalized understanding. Pt left department w/ no further questions. 

## 2012-01-25 DIAGNOSIS — Z8719 Personal history of other diseases of the digestive system: Secondary | ICD-10-CM | POA: Insufficient documentation

## 2012-01-25 DIAGNOSIS — R1031 Right lower quadrant pain: Secondary | ICD-10-CM | POA: Insufficient documentation

## 2012-01-25 DIAGNOSIS — Z87891 Personal history of nicotine dependence: Secondary | ICD-10-CM | POA: Insufficient documentation

## 2012-01-25 DIAGNOSIS — Z79899 Other long term (current) drug therapy: Secondary | ICD-10-CM | POA: Insufficient documentation

## 2012-01-25 DIAGNOSIS — Z8742 Personal history of other diseases of the female genital tract: Secondary | ICD-10-CM | POA: Insufficient documentation

## 2012-01-25 DIAGNOSIS — Z888 Allergy status to other drugs, medicaments and biological substances status: Secondary | ICD-10-CM | POA: Insufficient documentation

## 2012-01-25 DIAGNOSIS — Z8679 Personal history of other diseases of the circulatory system: Secondary | ICD-10-CM | POA: Insufficient documentation

## 2012-01-25 DIAGNOSIS — Z87442 Personal history of urinary calculi: Secondary | ICD-10-CM | POA: Insufficient documentation

## 2012-01-25 DIAGNOSIS — N83209 Unspecified ovarian cyst, unspecified side: Secondary | ICD-10-CM | POA: Insufficient documentation

## 2012-01-25 DIAGNOSIS — Z8669 Personal history of other diseases of the nervous system and sense organs: Secondary | ICD-10-CM | POA: Insufficient documentation

## 2012-01-25 DIAGNOSIS — Z791 Long term (current) use of non-steroidal anti-inflammatories (NSAID): Secondary | ICD-10-CM | POA: Insufficient documentation

## 2012-01-25 DIAGNOSIS — Z91018 Allergy to other foods: Secondary | ICD-10-CM | POA: Insufficient documentation

## 2012-01-26 ENCOUNTER — Encounter (HOSPITAL_COMMUNITY): Payer: Self-pay | Admitting: Emergency Medicine

## 2012-01-26 ENCOUNTER — Emergency Department (HOSPITAL_COMMUNITY)
Admission: EM | Admit: 2012-01-26 | Discharge: 2012-01-26 | Disposition: A | Discharge status: Home / Self Care | Payer: Self-pay | Attending: Emergency Medicine | Admitting: Emergency Medicine

## 2012-01-26 DIAGNOSIS — N83209 Unspecified ovarian cyst, unspecified side: Secondary | ICD-10-CM

## 2012-01-26 DIAGNOSIS — R109 Unspecified abdominal pain: Secondary | ICD-10-CM

## 2012-01-26 LAB — URINALYSIS, ROUTINE W REFLEX MICROSCOPIC
Glucose, UA: NEGATIVE mg/dL
Hgb urine dipstick: NEGATIVE
Ketones, ur: NEGATIVE mg/dL
Leukocytes, UA: NEGATIVE
Protein, ur: NEGATIVE mg/dL
Urobilinogen, UA: 0.2 mg/dL (ref 0.0–1.0)

## 2012-01-26 MED ORDER — IBUPROFEN 800 MG PO TABS: 800.0000 mg | ORAL_TABLET | Freq: Three times a day (TID) | ORAL | Status: DC

## 2012-01-26 MED ORDER — HYDROCODONE-ACETAMINOPHEN 5-325 MG PO TABS
2.0000 | ORAL_TABLET | Freq: Once | ORAL | Status: AC
  Administered 2012-01-26: 2 via ORAL
  Filled 2012-01-26: qty 2

## 2012-01-26 MED ORDER — HYDROCODONE-ACETAMINOPHEN 5-325 MG PO TABS: 1.0000 | ORAL_TABLET | ORAL | Status: AC | PRN

## 2012-01-26 NOTE — ED Notes (Signed)
Patient states she was treated here for ovarian cyst months ago and is complaining of lower right abdominal pain. States she is unable to follow up with MD until January.

## 2012-02-01 NOTE — ED Provider Notes (Signed)
History     CSN: [id number]  Arrival date & time 01/25/12  2343   First MD Initiated Contact with Patient 01/26/12 0115      Chief Complaint  Patient presents with  . Abdominal Pain    (Consider location/radiation/quality/duration/timing/severity/associated sxs/prior treatment) HPI [name] is a 21 y.o. female who presents to the Emergency Department complaining of pain to the right lower quadrant of her abdomen similar to pain she experienced a month ago when she was diagnosed with an ovarian cyst.She reports she is mid way in her menstrual cycle, had sharp right pain and now has pain that is aching and continuous. She has taken tylenol without relief. She denies fever, chills, vaginal bleeding or discharge, nausea, vomiting or diarrhea. She is scheduled to see her OB/GYN in January. Past Medical History  Diagnosis Date  . Migraines   . Seizures   . PID (acute pelvic inflammatory disease)   . Kidney stones   . Ovarian cyst   . Splenic mass     History reviewed. No pertinent past surgical history.  History reviewed. No pertinent family history.  History  Substance Use Topics  . Smoking status: Former Smoker -- 0.5 packs/day    Types: Cigarettes    Quit date: 08/12/2011  . Smokeless tobacco: Not on file  . Alcohol Use: No     Comment: former    OB History    Grav Para Term Preterm Abortions TAB SAB Ect Mult Living                  Review of Systems  Constitutional: Negative for fever.       10 Systems reviewed and are negative for acute change except as noted in the HPI.  HENT: Negative for congestion.   Eyes: Negative for discharge and redness.  Respiratory: Negative for cough and shortness of breath.   Cardiovascular: Negative for chest pain.  Gastrointestinal: Positive for abdominal pain. Negative for vomiting.  Musculoskeletal: Negative for back pain.  Skin: Negative for rash.  Neurological: Negative for syncope, numbness and headaches.    Psychiatric/Behavioral:       No behavior change.    Allergies  Monosodium glutamate; Red dye; and Tramadol  Home Medications   Current Outpatient Rx  Name  Route  Sig  Dispense  Refill  . ACETAMINOPHEN 500 MG PO TABS   Oral   Take 1,000 mg by mouth every 6 (six) hours as needed.         . AMOXICILLIN 500 MG PO CAPS   Oral   Take 500 mg by mouth 3 (three) times daily.         . AMOXICILLIN 500 MG PO CAPS   Oral   Take 1 capsule (500 mg total) by mouth 3 (three) times daily.   21 capsule   0   . BC HEADACHE POWDER PO   Oral   Take 1 packet by mouth 3 (three) times daily as needed. For pain         . BENZOCAINE 10 % MT GEL   Mouth/Throat   Use as directed in the mouth or throat as needed.         Marland Kitchen DICLOFENAC SODIUM 75 MG PO TBEC   Oral   Take 1 tablet (75 mg total) by mouth 2 (two) times daily.   12 tablet   0   . DOXYCYCLINE HYCLATE 100 MG PO CAPS   Oral   Take 1 capsule (100 mg  total) by mouth 2 (two) times daily.   28 capsule   0   . HYDROCODONE-ACETAMINOPHEN 5-325 MG PO TABS   Oral   Take 1 tablet by mouth every 4 (four) hours as needed for pain.   15 tablet   0   . HYDROCODONE-ACETAMINOPHEN 5-325 MG PO TABS   Oral   Take 1 tablet by mouth every 4 (four) hours as needed for pain.   15 tablet   0   . HYDROCODONE-ACETAMINOPHEN 5-325 MG PO TABS   Oral   Take 1 tablet by mouth every 4 (four) hours as needed for pain.   15 tablet   0   . IBUPROFEN 800 MG PO TABS   Oral   Take 1 tablet (800 mg total) by mouth 3 (three) times daily.   21 tablet   0   . MELOXICAM 7.5 MG PO TABS      1 po bid with food   12 tablet   0   . METRONIDAZOLE 500 MG PO TABS   Oral   Take 1 tablet (500 mg total) by mouth 2 (two) times daily. One po bid x 7 days   28 tablet   0   . OXYCODONE-ACETAMINOPHEN 5-325 MG PO TABS   Oral   Take 1-2 tablets by mouth every 6 (six) hours as needed for pain.   15 tablet   0     BP 106/61  Pulse 86  Temp 97.9 F  (36.6 C) (Oral)  Resp 20  Ht 5\' 3"  (1.6 m)  Wt 135 lb (61.236 kg)  BMI 23.91 kg/m2  SpO2 100%  LMP 01/02/2012  Physical Exam  Nursing note and vitals reviewed. Constitutional: She appears well-developed and well-nourished.       Awake, alert, nontoxic appearance.  HENT:  Head: Atraumatic.  Eyes: Right eye exhibits no discharge. Left eye exhibits no discharge.  Neck: Neck supple.  Cardiovascular: Normal heart sounds.   Pulmonary/Chest: Effort normal and breath sounds normal. She exhibits no tenderness.  Abdominal: Soft. Bowel sounds are normal. There is tenderness. There is no rebound.       Mild right lower quadrant tenderness with palpation, no guarding or rebound present.  Genitourinary:       No cva tenderness  Musculoskeletal: She exhibits no tenderness.       Baseline ROM, no obvious new focal weakness.  Neurological:       Mental status and motor strength appears baseline for patient and situation.  Skin: No rash noted.  Psychiatric: She has a normal mood and affect.    ED Course  Procedures (including critical care time) Results for orders placed during the hospital encounter of 01/26/12  URINALYSIS, ROUTINE W REFLEX MICROSCOPIC      Component Value Range   Color, Urine YELLOW  YELLOW   APPearance CLEAR  CLEAR   Specific Gravity, Urine 1.020  1.005 - 1.030   pH 6.0  5.0 - 8.0   Glucose, UA NEGATIVE  NEGATIVE mg/dL   Hgb urine dipstick NEGATIVE  NEGATIVE   Bilirubin Urine NEGATIVE  NEGATIVE   Ketones, ur NEGATIVE  NEGATIVE mg/dL   Protein, ur NEGATIVE  NEGATIVE mg/dL   Urobilinogen, UA 0.2  0.0 - 1.0 mg/dL   Nitrite NEGATIVE  NEGATIVE   Leukocytes, UA NEGATIVE  NEGATIVE       1. Abdominal pain   2. Ovarian cyst       MDM  Patient with previous ovarian cyst experiencing similar pain  to the right lower abdomen mid cycle as before. She has a follow up appointment with her OB/GYN in January and had an ultrasound and CT as recently as 11/2011. Given  analgesic with improvement.Dx testing d/w pt and family.  Questions answered.  Verb understanding, agreeable to d/c home with outpt f/u.   MDM Reviewed: previous chart, nursing note and vitals Reviewed previous: ultrasound and CT scan Interpretation: labs          Nicoletta Dress. Colon Branch, MD 02/01/12 [phone]

## 2012-02-09 ENCOUNTER — Encounter (HOSPITAL_COMMUNITY): Payer: Self-pay | Admitting: *Deleted

## 2012-02-09 ENCOUNTER — Emergency Department (HOSPITAL_COMMUNITY)
Admission: EM | Admit: 2012-02-09 | Discharge: 2012-02-10 | Disposition: A | Discharge status: Home / Self Care | Payer: Self-pay | Attending: Emergency Medicine | Admitting: Emergency Medicine

## 2012-02-09 DIAGNOSIS — Z87891 Personal history of nicotine dependence: Secondary | ICD-10-CM | POA: Insufficient documentation

## 2012-02-09 DIAGNOSIS — Z8719 Personal history of other diseases of the digestive system: Secondary | ICD-10-CM | POA: Insufficient documentation

## 2012-02-09 DIAGNOSIS — B349 Viral infection, unspecified: Secondary | ICD-10-CM

## 2012-02-09 DIAGNOSIS — G40909 Epilepsy, unspecified, not intractable, without status epilepticus: Secondary | ICD-10-CM | POA: Insufficient documentation

## 2012-02-09 DIAGNOSIS — Z3202 Encounter for pregnancy test, result negative: Secondary | ICD-10-CM | POA: Insufficient documentation

## 2012-02-09 DIAGNOSIS — B9789 Other viral agents as the cause of diseases classified elsewhere: Secondary | ICD-10-CM | POA: Insufficient documentation

## 2012-02-09 DIAGNOSIS — R112 Nausea with vomiting, unspecified: Secondary | ICD-10-CM | POA: Insufficient documentation

## 2012-02-09 DIAGNOSIS — Z8742 Personal history of other diseases of the female genital tract: Secondary | ICD-10-CM | POA: Insufficient documentation

## 2012-02-09 DIAGNOSIS — Z7982 Long term (current) use of aspirin: Secondary | ICD-10-CM | POA: Insufficient documentation

## 2012-02-09 DIAGNOSIS — Z87442 Personal history of urinary calculi: Secondary | ICD-10-CM | POA: Insufficient documentation

## 2012-02-09 MED ORDER — KETOROLAC TROMETHAMINE 30 MG/ML IJ SOLN
30.0000 mg | Freq: Once | INTRAMUSCULAR | Status: AC
  Administered 2012-02-09: 30 mg via INTRAVENOUS
  Filled 2012-02-09: qty 1

## 2012-02-09 MED ORDER — SODIUM CHLORIDE 0.9 % IV BOLUS (SEPSIS)
1000.0000 mL | Freq: Once | INTRAVENOUS | Status: AC
  Administered 2012-02-09: 1000 mL via INTRAVENOUS

## 2012-02-09 MED ORDER — DIPHENHYDRAMINE HCL 50 MG/ML IJ SOLN
50.0000 mg | Freq: Once | INTRAMUSCULAR | Status: AC
  Administered 2012-02-09: 50 mg via INTRAVENOUS
  Filled 2012-02-09: qty 1

## 2012-02-09 MED ORDER — METOCLOPRAMIDE HCL 5 MG/ML IJ SOLN
10.0000 mg | Freq: Once | INTRAMUSCULAR | Status: AC
  Administered 2012-02-09: 10 mg via INTRAVENOUS
  Filled 2012-02-09: qty 2

## 2012-02-09 NOTE — ED Notes (Signed)
Pt c/o migraine, n/v/d since this morning.

## 2012-02-10 LAB — URINALYSIS, ROUTINE W REFLEX MICROSCOPIC
Ketones, ur: NEGATIVE mg/dL
Leukocytes, UA: NEGATIVE
Nitrite: NEGATIVE
Urobilinogen, UA: 0.2 mg/dL (ref 0.0–1.0)
pH: 7 (ref 5.0–8.0)

## 2012-02-10 LAB — BASIC METABOLIC PANEL
Chloride: 103 mEq/L (ref 96–112)
Creatinine, Ser: 0.56 mg/dL (ref 0.50–1.10)
GFR calc Af Amer: >90 mL/min (ref >90)
GFR calc non Af Amer: >90 mL/min (ref >90)
Potassium: 3.4 mEq/L — ABNORMAL LOW (ref 3.5–5.1)

## 2012-02-10 MED ORDER — HYDROCODONE-ACETAMINOPHEN 5-325 MG PO TABS: 1.0000 | ORAL_TABLET | ORAL | Status: AC | PRN

## 2012-02-10 MED ORDER — DEXAMETHASONE SODIUM PHOSPHATE 10 MG/ML IJ SOLN
10.0000 mg | Freq: Once | INTRAMUSCULAR | Status: AC
  Administered 2012-02-10: 10 mg via INTRAVENOUS
  Filled 2012-02-10: qty 1

## 2012-02-10 MED ORDER — PROMETHAZINE HCL 25 MG PO TABS: 25.0000 mg | ORAL_TABLET | Freq: Four times a day (QID) | ORAL | Status: DC | PRN

## 2012-02-10 NOTE — ED Notes (Signed)
Pt discharged. Pt stable at time of discharge. Medications reviewed pt has no questions regarding discharge at this time. Pt voiced understanding of discharge instructions.  

## 2012-02-10 NOTE — ED Provider Notes (Signed)
History     CSN: [id number]  Arrival date & time 02/09/12  2144   First MD Initiated Contact with Patient 02/09/12 2220      Chief Complaint  Patient presents with  . Migraine    (Consider location/radiation/quality/duration/timing/severity/associated sxs/prior treatment) HPI Comments: [name] is a 22 y.o. Female with complaint of nausea, vomiting,  Diarrhea and headache since she woke early this morning.  She reports 2 episodes of nonbloody diarrhea,  Most recently 5 hours before arrival and multiple episodes of dry heaves and yellow foamy emesis,  The last episode containing pink tinged streaks.  She has had a "migraine" all day  As well,  And does have a history of migraines including photo and phonophobia which is similar to todays headache. There has been no  syncope, confusion or localized weakness.  The patient tried [facility] powders and has also taken an otc anti-nausea medication and imodium prior to arrival without relief of symptoms.  She also reports chills and generalized myalgias.     The history is provided by the patient.    Past Medical History  Diagnosis Date  . Migraines   . Seizures   . PID (acute pelvic inflammatory disease)   . Kidney stones   . Ovarian cyst   . Splenic mass     History reviewed. No pertinent past surgical history.  History reviewed. No pertinent family history.  History  Substance Use Topics  . Smoking status: Former Smoker -- 0.5 packs/day    Types: Cigarettes    Quit date: 08/12/2011  . Smokeless tobacco: Not on file  . Alcohol Use: No     Comment: former    OB History    Grav Para Term Preterm Abortions TAB SAB Ect Mult Living                  Review of Systems  Constitutional: Positive for chills. Negative for fever.  HENT: Positive for sore throat. Negative for congestion, neck pain and neck stiffness.   Eyes: Positive for photophobia. Negative for visual disturbance.  Respiratory: Negative for chest tightness and  shortness of breath.   Cardiovascular: Negative for chest pain.  Gastrointestinal: Positive for nausea, vomiting and diarrhea. Negative for abdominal pain.  Genitourinary: Negative.   Musculoskeletal: Positive for myalgias. Negative for joint swelling and arthralgias.  Skin: Negative.  Negative for rash and wound.  Neurological: Positive for headaches. Negative for dizziness, weakness, light-headedness and numbness.  Hematological: Negative.   Psychiatric/Behavioral: Negative.     Allergies  Monosodium glutamate; Red dye; and Tramadol  Home Medications   Current Outpatient Rx  Name  Route  Sig  Dispense  Refill  . BC HEADACHE POWDER PO   Oral   Take 1 packet by mouth 3 (three) times daily as needed. For pain         . ANTI-NAUSEA PO   Oral   Take 10 mLs by mouth every 6 (six) hours as needed. nausea         . LOPERAMIDE HCL 2 MG PO TABS   Oral   Take 2 mg by mouth every 6 (six) hours as needed. diarrhea           BP 127/71  Pulse 110  Temp 98.2 F (36.8 C) (Oral)  Resp 24  Ht 5\' 3"  (1.6 m)  Wt 140 lb (63.504 kg)  BMI 24.80 kg/m2  SpO2 100%  LMP 02/02/2012  Physical Exam  Nursing note and vitals reviewed. Constitutional:  She is oriented to person, place, and time. She appears well-developed and well-nourished.       Uncomfortable appearing  HENT:  Head: Normocephalic and atraumatic.  Mouth/Throat: Oropharynx is clear and moist.  Eyes: EOM are normal. Pupils are equal, round, and reactive to light.  Neck: Normal range of motion. Neck supple.  Cardiovascular: Normal rate and normal heart sounds.   Pulmonary/Chest: Effort normal.  Abdominal: Soft. She exhibits no distension. There is no tenderness. There is no rebound and no guarding.  Musculoskeletal: Normal range of motion.  Lymphadenopathy:    She has no cervical adenopathy.  Neurological: She is alert and oriented to person, place, and time. She has normal strength. No sensory deficit. Gait normal. GCS  eye subscore is 4. GCS verbal subscore is 5. GCS motor subscore is 6.       Normal heel-shin, normal rapid alternating movements. Cranial nerves III-XII intact.  No pronator drift.  Skin: Skin is warm and dry. No rash noted.  Psychiatric: She has a normal mood and affect. Her speech is normal and behavior is normal. Thought content normal. Cognition and memory are normal.    ED Course  Procedures (including critical care time)  Labs Reviewed  BASIC METABOLIC PANEL - Abnormal; Notable for the following:    Potassium 3.4 (*)     BUN 5 (*)     All other components within normal limits  URINALYSIS, ROUTINE W REFLEX MICROSCOPIC  POCT PREGNANCY, URINE   No results found.   1. Viral syndrome    Pt given IV fluids,  Migraine cocktail including reglan, toradol and benadryl with improved headache sx, now 5/10 from 10/10.  Decadron 10 mg IV given.  Oral trial of fluids given also prior to dc home.   No emesis or diarrhea while in ed.     MDM  Pt with viral syndrome including headache vs her normal chronic migraine.  Sx have improved with tx,  No neuro deficits on exam.  The patient appears reasonably screened and/or stabilized for discharge and I doubt any other medical condition or other [facility] requiring further screening, evaluation, or treatment in the ED at this time prior to discharge. She will be prescribed phenergan , few hydrocodone tabs,  encouraged rest,  Increased fluids,         Burgess Amor, PA 02/10/12 0129  Burgess Amor, PA 02/11/12 [phone]

## 2012-02-18 NOTE — ED Provider Notes (Signed)
Medical screening examination/treatment/procedure(s) were performed by non-physician practitioner and as supervising physician I was immediately available for consultation/collaboration.  Nicoletta Dress. Colon Branch, MD 02/18/12 0981

## 2012-03-22 ENCOUNTER — Encounter (HOSPITAL_COMMUNITY): Payer: Self-pay | Admitting: *Deleted

## 2012-03-22 ENCOUNTER — Emergency Department (HOSPITAL_COMMUNITY)
Admission: EM | Admit: 2012-03-22 | Discharge: 2012-03-22 | Disposition: A | Discharge status: Home / Self Care | Payer: Self-pay | Attending: Emergency Medicine | Admitting: Emergency Medicine

## 2012-03-22 DIAGNOSIS — X58XXXA Exposure to other specified factors, initial encounter: Secondary | ICD-10-CM | POA: Insufficient documentation

## 2012-03-22 DIAGNOSIS — Z87442 Personal history of urinary calculi: Secondary | ICD-10-CM | POA: Insufficient documentation

## 2012-03-22 DIAGNOSIS — J029 Acute pharyngitis, unspecified: Secondary | ICD-10-CM | POA: Insufficient documentation

## 2012-03-22 DIAGNOSIS — H9209 Otalgia, unspecified ear: Secondary | ICD-10-CM | POA: Insufficient documentation

## 2012-03-22 DIAGNOSIS — Z8742 Personal history of other diseases of the female genital tract: Secondary | ICD-10-CM | POA: Insufficient documentation

## 2012-03-22 DIAGNOSIS — H53149 Visual discomfort, unspecified: Secondary | ICD-10-CM | POA: Insufficient documentation

## 2012-03-22 DIAGNOSIS — Z8669 Personal history of other diseases of the nervous system and sense organs: Secondary | ICD-10-CM | POA: Insufficient documentation

## 2012-03-22 DIAGNOSIS — S025XXA Fracture of tooth (traumatic), initial encounter for closed fracture: Secondary | ICD-10-CM | POA: Insufficient documentation

## 2012-03-22 DIAGNOSIS — R51 Headache: Secondary | ICD-10-CM | POA: Insufficient documentation

## 2012-03-22 DIAGNOSIS — R5381 Other malaise: Secondary | ICD-10-CM | POA: Insufficient documentation

## 2012-03-22 DIAGNOSIS — J069 Acute upper respiratory infection, unspecified: Secondary | ICD-10-CM | POA: Insufficient documentation

## 2012-03-22 DIAGNOSIS — Z7982 Long term (current) use of aspirin: Secondary | ICD-10-CM | POA: Insufficient documentation

## 2012-03-22 DIAGNOSIS — R112 Nausea with vomiting, unspecified: Secondary | ICD-10-CM | POA: Insufficient documentation

## 2012-03-22 DIAGNOSIS — Z8679 Personal history of other diseases of the circulatory system: Secondary | ICD-10-CM | POA: Insufficient documentation

## 2012-03-22 DIAGNOSIS — Z79899 Other long term (current) drug therapy: Secondary | ICD-10-CM | POA: Insufficient documentation

## 2012-03-22 DIAGNOSIS — Y929 Unspecified place or not applicable: Secondary | ICD-10-CM | POA: Insufficient documentation

## 2012-03-22 DIAGNOSIS — Z8719 Personal history of other diseases of the digestive system: Secondary | ICD-10-CM | POA: Insufficient documentation

## 2012-03-22 DIAGNOSIS — Y939 Activity, unspecified: Secondary | ICD-10-CM | POA: Insufficient documentation

## 2012-03-22 DIAGNOSIS — Z87891 Personal history of nicotine dependence: Secondary | ICD-10-CM | POA: Insufficient documentation

## 2012-03-22 MED ORDER — ONDANSETRON 8 MG PO TBDP
8.0000 mg | ORAL_TABLET | Freq: Once | ORAL | Status: AC
  Administered 2012-03-22: 8 mg via ORAL
  Filled 2012-03-22: qty 1

## 2012-03-22 MED ORDER — NAPROXEN 500 MG PO TABS: ORAL_TABLET | ORAL | Status: DC

## 2012-03-22 MED ORDER — OXYCODONE-ACETAMINOPHEN 5-325 MG PO TABS
2.0000 | ORAL_TABLET | Freq: Once | ORAL | Status: AC
  Administered 2012-03-22: 2 via ORAL
  Filled 2012-03-22: qty 2

## 2012-03-22 MED ORDER — DOXYCYCLINE HYCLATE 100 MG PO CAPS: 100.0000 mg | ORAL_CAPSULE | Freq: Two times a day (BID) | ORAL | Status: DC

## 2012-03-22 MED ORDER — KETOROLAC TROMETHAMINE 60 MG/2ML IM SOLN
60.0000 mg | Freq: Once | INTRAMUSCULAR | Status: AC
  Administered 2012-03-22: 60 mg via INTRAMUSCULAR
  Filled 2012-03-22: qty 2

## 2012-03-22 MED ORDER — ONDANSETRON HCL 4 MG PO TABS: 4.0000 mg | ORAL_TABLET | Freq: Four times a day (QID) | ORAL | Status: DC

## 2012-03-22 NOTE — ED Provider Notes (Signed)
History    [name] is a 22 y.o. female with cc "migraine headache" which started acutely this morning, is constant, with associated symptoms of nausea, vomiting, sore throat and sinus pain.  She has taken tylenol without relief.  She complains also of dental pain.  She has several cracked/caried teeth which began hurting 1 week ago.  She had a "nasty taste" in her mouth when she awoke this am and something was "really smelly" in her mouth.  She complains of jaw pain radiating to her ears.   CSN: [id number]  Arrival date & time 03/22/12  2030   First MD Initiated Contact with Patient 03/22/12 2057      Chief Complaint  Patient presents with  . Headache  . Dental Pain    (Consider location/radiation/quality/duration/timing/severity/associated sxs/prior treatment) Patient is a 22 y.o. female presenting with headaches and tooth pain.  Headache Associated symptoms: ear pain, fatigue, nausea, photophobia, sinus pressure, sore throat and vomiting   Associated symptoms: no abdominal pain, no congestion, no diarrhea, no pain and no hearing loss   Dental PainThe primary symptoms include headaches and sore throat. Primary symptoms do not include shortness of breath.  The headache is associated with photophobia. The headache is not associated with eye pain.  The sore throat is not accompanied by trouble swallowing.  Additional symptoms include: ear pain and fatigue. Additional symptoms do not include: trouble swallowing and hearing loss.    Past Medical History  Diagnosis Date  . Migraines   . Seizures   . PID (acute pelvic inflammatory disease)   . Kidney stones   . Ovarian cyst   . Splenic mass     History reviewed. No pertinent past surgical history.  History reviewed. No pertinent family history.  History  Substance Use Topics  . Smoking status: Former Smoker -- 0.50 packs/day    Types: Cigarettes    Quit date: 08/12/2011  . Smokeless tobacco: Not on file  . Alcohol  Use: No     Comment: former    OB History   Grav Para Term Preterm Abortions TAB SAB Ect Mult Living                  Review of Systems  Constitutional: Positive for chills and fatigue. Negative for diaphoresis.  HENT: Positive for ear pain, sore throat, dental problem and sinus pressure. Negative for hearing loss, congestion, trouble swallowing, voice change and tinnitus.   Eyes: Positive for photophobia. Negative for pain and discharge.  Respiratory: Negative for chest tightness and shortness of breath.   Cardiovascular: Negative for chest pain.  Gastrointestinal: Positive for nausea and vomiting. Negative for abdominal pain, diarrhea and abdominal distention.  Neurological: Positive for headaches.    Allergies  Monosodium glutamate; Red dye; and Tramadol  Home Medications   Current Outpatient Rx  Name  Route  Sig  Dispense  Refill  . Aspirin-Salicylamide-Caffeine (BC HEADACHE POWDER PO)   Oral   Take 1 packet by mouth 3 (three) times daily as needed. For pain         . doxycycline (VIBRAMYCIN) 100 MG capsule   Oral   Take 1 capsule (100 mg total) by mouth 2 (two) times daily.   20 capsule   0   . naproxen (NAPROSYN) 500 MG tablet      Take 1 tablet twice a day with food as needed for headache.   30 tablet   0   . ondansetron (ZOFRAN) 4 MG tablet  Oral   Take 1 tablet (4 mg total) by mouth every 6 (six) hours.   12 tablet   0     BP 107/67  Pulse 85  Temp(Src) 98.2 F (36.8 C) (Oral)  Resp 24  Ht 5\' 3"  (1.6 m)  Wt 135 lb (61.236 kg)  BMI 23.92 kg/m2  SpO2 100%  LMP 03/08/2012  Physical Exam  Nursing note and vitals reviewed. Constitutional: She is oriented to person, place, and time. She appears well-developed and well-nourished.  HENT:  Head: Normocephalic and atraumatic.  Mouth/Throat: Dental caries present. No dental abscesses.  Multiple dental fractures and caries.  No obvious exudate or abscess noted.  Erythema of gumline around fx teeth.   Eyes: Conjunctivae and EOM are normal. Pupils are equal, round, and reactive to light.  No photophobia with eye exam.  Neck: Normal range of motion. Neck supple.  Cervical node tenderness, no swelling on palpation.  Cardiovascular: Normal rate, regular rhythm and normal heart sounds.   Pulmonary/Chest: Effort normal and breath sounds normal. She has no wheezes. She has no rales.  Abdominal: Soft. Bowel sounds are normal. There is no tenderness. There is no rebound and no guarding.  Lymphadenopathy:    She has no cervical adenopathy.  Neurological: She is alert and oriented to person, place, and time.    ED Course  Procedures (including critical care time)  Labs Reviewed - No data to display No results found.   1. Fracture, tooth   2. Headache   3. URI, acute      MDM   Patient given naproxen for headaches, doxycycline to cover both URI/possible dental abscess.  Instructed to see dental provider for tooth issues.  See PCP in 2-3 days to review today's notes and follow up on chills, sore throat, HA, N/V.  Discussed case with Dr. Hyacinth Meeker.  Patient acknowledges and agrees with this treatment plan.       Peggi Yono, PA-C 03/22/12 2154

## 2012-03-22 NOTE — ED Notes (Signed)
Pt c/o migraine and has vomited 4-5 times today. Pt also has several abscessed teeth and feels her headache may be coming from her teeth.

## 2012-03-24 NOTE — ED Provider Notes (Signed)
  Medical screening examination/treatment/procedure(s) were conducted as a shared visit with non-physician practitioner(s) and myself.  I personally evaluated the patient during the encounter   The patient with a migraine headache, dental pain as well, on exam she has a clear throat, mild erythema, no exudate asymmetry or hypertrophy, some poor dentition but no significant periapical abscesses or asymmetry of the jaw. Clear heart and lung sounds, patient to be treated with an antibiotic as well as NSAIDs, stable for discharge  Vida Roller, MD 03/24/12 [phone]

## 2012-04-16 ENCOUNTER — Emergency Department (HOSPITAL_COMMUNITY)
Admission: EM | Admit: 2012-04-16 | Discharge: 2012-04-16 | Disposition: A | Discharge status: Home / Self Care | Payer: Self-pay | Attending: Emergency Medicine | Admitting: Emergency Medicine

## 2012-04-16 ENCOUNTER — Encounter (HOSPITAL_COMMUNITY): Payer: Self-pay | Admitting: Emergency Medicine

## 2012-04-16 DIAGNOSIS — K089 Disorder of teeth and supporting structures, unspecified: Secondary | ICD-10-CM | POA: Insufficient documentation

## 2012-04-16 DIAGNOSIS — Z8742 Personal history of other diseases of the female genital tract: Secondary | ICD-10-CM | POA: Insufficient documentation

## 2012-04-16 DIAGNOSIS — Z87891 Personal history of nicotine dependence: Secondary | ICD-10-CM | POA: Insufficient documentation

## 2012-04-16 DIAGNOSIS — Z8679 Personal history of other diseases of the circulatory system: Secondary | ICD-10-CM | POA: Insufficient documentation

## 2012-04-16 DIAGNOSIS — Z8669 Personal history of other diseases of the nervous system and sense organs: Secondary | ICD-10-CM | POA: Insufficient documentation

## 2012-04-16 DIAGNOSIS — Z87442 Personal history of urinary calculi: Secondary | ICD-10-CM | POA: Insufficient documentation

## 2012-04-16 DIAGNOSIS — K0889 Other specified disorders of teeth and supporting structures: Secondary | ICD-10-CM

## 2012-04-16 MED ORDER — AMOXICILLIN 250 MG PO CAPS: 250.0000 mg | ORAL_CAPSULE | Freq: Three times a day (TID) | ORAL | Status: DC

## 2012-04-16 MED ORDER — AMOXICILLIN 250 MG PO CAPS
500.0000 mg | ORAL_CAPSULE | Freq: Once | ORAL | Status: AC
  Administered 2012-04-16: 500 mg via ORAL
  Filled 2012-04-16: qty 2

## 2012-04-16 MED ORDER — HYDROCODONE-ACETAMINOPHEN 5-325 MG PO TABS: 1.0000 | ORAL_TABLET | ORAL | Status: DC | PRN

## 2012-04-16 MED ORDER — OXYCODONE-ACETAMINOPHEN 5-325 MG PO TABS
1.0000 | ORAL_TABLET | Freq: Once | ORAL | Status: AC
  Administered 2012-04-16: 1 via ORAL
  Filled 2012-04-16: qty 1

## 2012-04-16 NOTE — ED Notes (Signed)
Pt c/o right upper dental pain. Pt states she has an abscess to left upper gum and she attempted to drain it at home today. Pt states that while draining the abscess she felt a "crack" on her tooth and reports pain in that area.

## 2012-05-08 NOTE — ED Provider Notes (Signed)
History     CSN: [id number]  Arrival date & time 04/16/12  2019   First MD Initiated Contact with Patient 04/16/12 2054      Chief Complaint  Patient presents with  . Dental Pain    (Consider location/radiation/quality/duration/timing/severity/associated sxs/prior treatment) HPI  AMYE GREGO is a 22 y.o. female who presents to the ED with dental pain. The pain is located in the upper dental area on the right. She states she has an abscess to the left upper gum and she attempted to drain it at home today. While she was working with the area she felt a crack in the tooth and it became very painful. The history was provided by the patient.   Past Medical History  Diagnosis Date  . Migraines   . Seizures   . PID (acute pelvic inflammatory disease)   . Kidney stones   . Ovarian cyst   . Splenic mass     History reviewed. No pertinent past surgical history.  History reviewed. No pertinent family history.  History  Substance Use Topics  . Smoking status: Former Smoker -- 0.50 packs/day    Types: Cigarettes    Quit date: 08/12/2011  . Smokeless tobacco: Not on file  . Alcohol Use: No     Comment: former    OB History   Grav Para Term Preterm Abortions TAB SAB Ect Mult Living                  Review of Systems  Constitutional: Negative for fever and chills.  HENT: Positive for dental problem.   Respiratory: Negative for shortness of breath.   Gastrointestinal: Negative for nausea and vomiting.  Neurological: Negative for headaches.  Psychiatric/Behavioral: The patient is not nervous/anxious.     Allergies  Monosodium glutamate; Red dye; and Tramadol  Home Medications   Current Outpatient Rx  Name  Route  Sig  Dispense  Refill  . acetaminophen (TYLENOL) 500 MG tablet   Oral   Take 2,000 mg by mouth daily as needed for pain.         . Aspirin-Salicylamide-Caffeine (BC HEADACHE POWDER PO)   Oral   Take 1 packet by mouth 3 (three) times daily as needed.  For pain         . benzocaine (ORAJEL) 10 % mucosal gel   Mouth/Throat   Use as directed 1 application in the mouth or throat as needed for pain.         Marland Kitchen amoxicillin (AMOXIL) 250 MG capsule   Oral   Take 1 capsule (250 mg total) by mouth 3 (three) times daily.   30 capsule   0   . HYDROcodone-acetaminophen (NORCO/VICODIN) 5-325 MG per tablet   Oral   Take 1 tablet by mouth every 4 (four) hours as needed for pain.   15 tablet   0     BP 122/93  Pulse 76  Temp(Src) 98.4 F (36.9 C) (Oral)  Resp 20  SpO2 100%  LMP 03/08/2012  Physical Exam  Nursing note and vitals reviewed. Constitutional: She appears well-developed and well-nourished. No distress.  HENT:  Head: Normocephalic.  Mouth/Throat: Uvula is midline and oropharynx is clear and moist.    Swelling and tendernessnoted in the gum surrounding the upper teeth on the right.  Eyes: EOM are normal.  Neck: Neck supple.  Cardiovascular: Normal rate.   Pulmonary/Chest: Effort normal.  Musculoskeletal: Normal range of motion.  Psychiatric: She has a normal mood and affect. Her  behavior is normal.    ED Course  Procedures (including critical care time)   1. Pain, dental     MDM  22 y.o. female with dental pain and possible early dental abscess. Discussed clinical findings and plan of care with the patient. Patient voices understanding. Patient stable for discharge with antibiotics and pain management.   Medication List    TAKE these medications       amoxicillin 250 MG capsule  Commonly known as:  AMOXIL  Take 1 capsule (250 mg total) by mouth 3 (three) times daily.     HYDROcodone-acetaminophen 5-325 MG per tablet  Commonly known as:  NORCO/VICODIN  Take 1 tablet by mouth every 4 (four) hours as needed for pain.      ASK your doctor about these medications       acetaminophen 500 MG tablet  Commonly known as:  TYLENOL  Take 2,000 mg by mouth daily as needed for pain.     BC HEADACHE POWDER PO    Take 1 packet by mouth 3 (three) times daily as needed. For pain     benzocaine 10 % mucosal gel  Commonly known as:  ORAJEL  Use as directed 1 application in the mouth or throat as needed for pain.               [facility] Orlene Och, NP 05/08/12 1700

## 2012-05-10 NOTE — ED Provider Notes (Signed)
Medical screening examination/treatment/procedure(s) were performed by non-physician practitioner and as supervising physician I was immediately available for consultation/collaboration. Devoria Albe, MD, Armando Gang   Ward Givens, MD 05/10/12 [phone]

## 2012-07-06 ENCOUNTER — Emergency Department (HOSPITAL_COMMUNITY)
Admission: EM | Admit: 2012-07-06 | Discharge: 2012-07-06 | Disposition: A | Discharge status: Home / Self Care | Payer: Self-pay | Attending: Emergency Medicine | Admitting: Emergency Medicine

## 2012-07-06 ENCOUNTER — Encounter (HOSPITAL_COMMUNITY): Payer: Self-pay | Admitting: *Deleted

## 2012-07-06 DIAGNOSIS — Z8669 Personal history of other diseases of the nervous system and sense organs: Secondary | ICD-10-CM | POA: Insufficient documentation

## 2012-07-06 DIAGNOSIS — Z8742 Personal history of other diseases of the female genital tract: Secondary | ICD-10-CM | POA: Insufficient documentation

## 2012-07-06 DIAGNOSIS — Z87891 Personal history of nicotine dependence: Secondary | ICD-10-CM | POA: Insufficient documentation

## 2012-07-06 DIAGNOSIS — Z8719 Personal history of other diseases of the digestive system: Secondary | ICD-10-CM | POA: Insufficient documentation

## 2012-07-06 DIAGNOSIS — G43909 Migraine, unspecified, not intractable, without status migrainosus: Secondary | ICD-10-CM | POA: Insufficient documentation

## 2012-07-06 DIAGNOSIS — K089 Disorder of teeth and supporting structures, unspecified: Secondary | ICD-10-CM | POA: Insufficient documentation

## 2012-07-06 DIAGNOSIS — Z87442 Personal history of urinary calculi: Secondary | ICD-10-CM | POA: Insufficient documentation

## 2012-07-06 DIAGNOSIS — K029 Dental caries, unspecified: Secondary | ICD-10-CM | POA: Insufficient documentation

## 2012-07-06 MED ORDER — HYDROCODONE-ACETAMINOPHEN 5-325 MG PO TABS
1.0000 | ORAL_TABLET | ORAL | Status: DC | PRN
Start: 2012-07-06 — End: 2012-09-02

## 2012-07-06 MED ORDER — AMOXICILLIN 500 MG PO CAPS: 500.0000 mg | ORAL_CAPSULE | Freq: Three times a day (TID) | ORAL | Status: DC

## 2012-07-06 NOTE — ED Notes (Signed)
Dental pain for 3 days  

## 2012-07-06 NOTE — ED Provider Notes (Signed)
History     CSN: [id number]  Arrival date & time 07/06/12  1801   First MD Initiated Contact with Patient 07/06/12 1920      Chief Complaint  Patient presents with  . Dental Pain    (Consider location/radiation/quality/duration/timing/severity/associated sxs/prior treatment) HPI [name] is a 22 y.o. female who presents to the ED with dental pain. The pain started 3 days ago. The describes the pain as throbbing. The pain is moderate. The painful tooth is located on the bottom right. The history was provided by the patient.  Past Medical History  Diagnosis Date  . Migraines   . Seizures   . PID (acute pelvic inflammatory disease)   . Ovarian cyst   . Splenic mass   . Kidney stones     History reviewed. No pertinent past surgical history.  History reviewed. No pertinent family history.  History  Substance Use Topics  . Smoking status: Former Smoker -- 0.50 packs/day    Types: Cigarettes    Quit date: 08/12/2011  . Smokeless tobacco: Not on file  . Alcohol Use: No     Comment: former    OB History   Grav Para Term Preterm Abortions TAB SAB Ect Mult Living                  Review of Systems  Constitutional: Negative for fever and chills.  HENT: Positive for dental problem. Negative for sore throat.   Respiratory: Negative for cough.   Gastrointestinal: Negative for nausea and vomiting.  Skin: Negative for rash.  Neurological: Negative for headaches.  Psychiatric/Behavioral: The patient is not nervous/anxious.     Allergies  Monosodium glutamate; Red dye; and Tramadol  Home Medications   Current Outpatient Rx  Name  Route  Sig  Dispense  Refill  . acetaminophen (TYLENOL) 500 MG tablet   Oral   Take 2,000 mg by mouth daily as needed for pain.         Marland Kitchen amoxicillin (AMOXIL) 250 MG capsule   Oral   Take 1 capsule (250 mg total) by mouth 3 (three) times daily.   30 capsule   0   . Aspirin-Salicylamide-Caffeine (BC HEADACHE POWDER PO)   Oral   Take 1 packet by mouth 3 (three) times daily as needed. For pain         . benzocaine (ORAJEL) 10 % mucosal gel   Mouth/Throat   Use as directed 1 application in the mouth or throat as needed for pain.         Marland Kitchen HYDROcodone-acetaminophen (NORCO/VICODIN) 5-325 MG per tablet   Oral   Take 1 tablet by mouth every 4 (four) hours as needed for pain.   15 tablet   0     BP 118/63  Pulse 74  Temp(Src) 98.3 F (36.8 C) (Oral)  Resp 18  Ht 5\' 3"  (1.6 m)  Wt 135 lb (61.236 kg)  BMI 23.92 kg/m2  SpO2 100%  LMP 06/22/2012  Physical Exam  Nursing note and vitals reviewed. Constitutional: She is oriented to person, place, and time. She appears well-developed and well-nourished. No distress.  HENT:  Head: Normocephalic and atraumatic.  Right Ear: Tympanic membrane normal.  Left Ear: Tympanic membrane normal.  Nose: Nose normal.  Mouth/Throat: Uvula is midline, oropharynx is clear and moist and mucous membranes are normal. Dental caries present.    Tender with palpation right lower first molar  Eyes: EOM are normal.  Neck: Neck supple.  Cardiovascular: Normal rate,  regular rhythm and normal heart sounds.   Pulmonary/Chest: Effort normal and breath sounds normal.  Abdominal: Soft. There is no tenderness.  Musculoskeletal: Normal range of motion.  Neurological: She is alert and oriented to person, place, and time. No cranial nerve deficit.  Skin: Skin is warm and dry.  Psychiatric: She has a normal mood and affect. Her behavior is normal.    ED Course  Procedures (including critical care time)  MDM  22 y.o. female with dental pain due to caries. Will start antibiotics and pain medication and she will follow up with a dentist as soon as possible. I have reviewed this patient's vital signs, nurses notes and discussed clinical findings and plan of care with the patient and she voices understanding.    Medication List    TAKE these medications       amoxicillin 500 MG capsule   Commonly known as:  AMOXIL  Take 1 capsule (500 mg total) by mouth 3 (three) times daily.     HYDROcodone-acetaminophen 5-325 MG per tablet  Commonly known as:  NORCO/VICODIN  Take 1 tablet by mouth every 4 (four) hours as needed.      ASK your doctor about these medications       BC HEADACHE POWDER PO  Take 1 packet by mouth 3 (three) times daily as needed. For pain     benzocaine 10 % mucosal gel  Commonly known as:  ORAJEL  Use as directed 1 application in the mouth or throat as needed for pain.     ibuprofen 200 MG tablet  Commonly known as:  ADVIL,MOTRIN  Take 1,200 mg by mouth 5 (five) times daily.             [facility] Orlene Och, [place] 07/06/12 1950

## 2012-07-07 NOTE — ED Provider Notes (Signed)
Medical screening examination/treatment/procedure(s) were performed by non-physician practitioner and as supervising physician I was immediately available for consultation/collaboration.  [name], MD 07/07/12 [phone]

## 2012-08-12 ENCOUNTER — Emergency Department (HOSPITAL_COMMUNITY)
Admission: EM | Admit: 2012-08-12 | Discharge: 2012-08-13 | Discharge status: Against Medical Advice | Payer: Self-pay | Attending: Emergency Medicine | Admitting: Emergency Medicine

## 2012-08-12 ENCOUNTER — Encounter (HOSPITAL_COMMUNITY): Payer: Self-pay | Admitting: *Deleted

## 2012-08-12 DIAGNOSIS — Z3202 Encounter for pregnancy test, result negative: Secondary | ICD-10-CM | POA: Insufficient documentation

## 2012-08-12 DIAGNOSIS — N949 Unspecified condition associated with female genital organs and menstrual cycle: Secondary | ICD-10-CM | POA: Insufficient documentation

## 2012-08-12 DIAGNOSIS — Z87891 Personal history of nicotine dependence: Secondary | ICD-10-CM | POA: Insufficient documentation

## 2012-08-12 DIAGNOSIS — Z79899 Other long term (current) drug therapy: Secondary | ICD-10-CM | POA: Insufficient documentation

## 2012-08-12 DIAGNOSIS — Z87442 Personal history of urinary calculi: Secondary | ICD-10-CM | POA: Insufficient documentation

## 2012-08-12 DIAGNOSIS — Z8669 Personal history of other diseases of the nervous system and sense organs: Secondary | ICD-10-CM | POA: Insufficient documentation

## 2012-08-12 DIAGNOSIS — R102 Pelvic and perineal pain: Secondary | ICD-10-CM

## 2012-08-12 DIAGNOSIS — Z8742 Personal history of other diseases of the female genital tract: Secondary | ICD-10-CM | POA: Insufficient documentation

## 2012-08-12 DIAGNOSIS — Z8679 Personal history of other diseases of the circulatory system: Secondary | ICD-10-CM | POA: Insufficient documentation

## 2012-08-12 DIAGNOSIS — Z8719 Personal history of other diseases of the digestive system: Secondary | ICD-10-CM | POA: Insufficient documentation

## 2012-08-12 NOTE — ED Notes (Addendum)
Pt states she was taking a shower when she had sharp stabbing pains to the right lower quadrant. Pt denies any vomiting or diarrhea. Pt states a history of ovarian cyst.

## 2012-08-13 LAB — URINALYSIS, ROUTINE W REFLEX MICROSCOPIC
Bilirubin Urine: NEGATIVE
Glucose, UA: NEGATIVE mg/dL
Hgb urine dipstick: NEGATIVE
Ketones, ur: NEGATIVE mg/dL
Protein, ur: NEGATIVE mg/dL
Urobilinogen, UA: 0.2 mg/dL (ref 0.0–1.0)

## 2012-08-13 MED ORDER — KETOROLAC TROMETHAMINE 30 MG/ML IJ SOLN
30.0000 mg | Freq: Once | INTRAMUSCULAR | Status: AC
  Administered 2012-08-13: 30 mg via INTRAVENOUS
  Filled 2012-08-13: qty 1

## 2012-08-13 NOTE — ED Provider Notes (Signed)
History    CSN: [id number] Arrival date & time 08/12/12  2340  First MD Initiated Contact with Patient 08/13/12 0004     Chief Complaint  Patient presents with  . Abdominal Pain   (Consider location/radiation/quality/duration/timing/severity/associated sxs/prior Treatment) HPI Patient reports she has a history of ovarian cysts seen on ultrasound in the past. She states about one and a half hours ago while she was in the shower she had acute onset of right lower quadrant pain that she states is throbbing and sharp. She states standing up straight makes the pain worse. She states if she holds pressure on her right lower quadrant it makes it feel a little bit better. She denies nausea, vomiting, diarrhea, dysuria, frequency, or vaginal discharge. Patient states she is G0 P0 her last normal period was 3 weeks ago. Patient is a lesbian and denies using any type of sex toys or possible sexually transmitted disease.   PCP none GYN Dr Emelda Fear  Past Medical History  Diagnosis Date  . Migraines   . Seizures   . PID (acute pelvic inflammatory disease)   . Ovarian cyst   . Splenic mass   . Kidney stones    History reviewed. No pertinent past surgical history. No family history on file. History  Substance Use Topics  . Smoking status: Former Smoker -- 0.50 packs/day    Types: Cigarettes    Quit date: 08/12/2011  . Smokeless tobacco: Not on file  . Alcohol Use: No     Comment: former   unemployed  OB History   Grav Para Term Preterm Abortions TAB SAB Ect Mult Living                 Review of Systems  All other systems reviewed and are negative.    Allergies  Monosodium glutamate; Red dye; and Tramadol  Home Medications   Current Outpatient Rx  Name  Route  Sig  Dispense  Refill  . Aspirin-Salicylamide-Caffeine (BC HEADACHE POWDER PO)   Oral   Take 1 packet by mouth 3 (three) times daily as needed. For pain         . ibuprofen (ADVIL,MOTRIN) 200 MG tablet   Oral  Take 1,200 mg by mouth 5 (five) times daily.         Marland Kitchen amoxicillin (AMOXIL) 500 MG capsule   Oral   Take 1 capsule (500 mg total) by mouth 3 (three) times daily.   21 capsule   0   . benzocaine (ORAJEL) 10 % mucosal gel   Mouth/Throat   Use as directed 1 application in the mouth or throat as needed for pain.         Marland Kitchen HYDROcodone-acetaminophen (NORCO/VICODIN) 5-325 MG per tablet   Oral   Take 1 tablet by mouth every 4 (four) hours as needed.   15 tablet   0    BP 122/70  Pulse 68  Temp(Src) 98 F (36.7 C) (Oral)  Resp 20  Ht 5\' 3"  (1.6 m)  Wt 140 lb (63.504 kg)  BMI 24.81 kg/m2  SpO2 100%  LMP 07/29/2012  Vital signs normal   Physical Exam  Nursing note and vitals reviewed. Constitutional: She is oriented to person, place, and time. She appears well-developed and well-nourished.  Non-toxic appearance. She does not appear ill. No distress.  HENT:  Head: Normocephalic and atraumatic.  Right Ear: External ear normal.  Left Ear: External ear normal.  Nose: Nose normal. No mucosal edema or rhinorrhea.  Mouth/Throat: Oropharynx  is clear and moist and mucous membranes are normal. No dental abscesses or edematous.  Eyes: Conjunctivae and EOM are normal. Pupils are equal, round, and reactive to light.  Neck: Normal range of motion and full passive range of motion without pain. Neck supple.  Cardiovascular: Normal rate, regular rhythm and normal heart sounds.  Exam reveals no gallop and no friction rub.   No murmur heard. Pulmonary/Chest: Effort normal and breath sounds normal. No respiratory distress. She has no wheezes. She has no rhonchi. She has no rales. She exhibits no tenderness and no crepitus.  Abdominal: Soft. Normal appearance and bowel sounds are normal. She exhibits no distension. There is tenderness. There is no rebound and no guarding.    Musculoskeletal: Normal range of motion. She exhibits no edema and no tenderness.  Moves all extremities well.     Neurological: She is alert and oriented to person, place, and time. She has normal strength. No cranial nerve deficit.  Skin: Skin is warm, dry and intact. No rash noted. No erythema. No pallor.  Psychiatric: She has a normal mood and affect. Her speech is normal and behavior is normal. Her mood appears not anxious.    ED Course  Procedures (including critical care time) Medications  ketorolac (TORADOL) 30 MG/ML injection 30 mg (30 mg Intravenous Given 08/13/12 0039)   Patient had CT of her abdomen pelvis in October 2013 which did not show any acute changes. She had ultrasound in October 2013 with 2 small cysts on her right ovary. She also had a CT of her abdomen in August 2010 that was normal. Patient is noted to be a frequent ED visit her for abdominal pain and dental pain. She has had 10 ED visits since 11/12/2011. 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  Pt left AMA after given meds.   Results for orders placed during the hospital encounter of 08/12/12  URINALYSIS, ROUTINE W REFLEX MICROSCOPIC      Result Value Range   Color, Urine YELLOW  YELLOW   APPearance CLEAR  CLEAR   Specific Gravity, Urine 1.025  1.005 - 1.030   pH 6.5  5.0 - 8.0   Glucose, UA NEGATIVE  NEGATIVE mg/dL   Hgb urine dipstick NEGATIVE  NEGATIVE   Bilirubin Urine NEGATIVE  NEGATIVE   Ketones, ur NEGATIVE  NEGATIVE mg/dL   Protein, ur NEGATIVE  NEGATIVE mg/dL   Urobilinogen, UA 0.2  0.0 - 1.0 mg/dL   Nitrite NEGATIVE  NEGATIVE   Leukocytes, UA NEGATIVE  NEGATIVE  PREGNANCY, URINE      Result Value Range   Preg Test, Ur NEGATIVE  NEGATIVE   Laboratory interpretation all normal     1. Pelvic pain     Pt left AMA   Devoria Albe, MD, FACEP    MDM    Ward Givens, MD 08/13/12 [phone]

## 2012-08-13 NOTE — ED Notes (Signed)
Pt stated she had some family issues she needed to take care of & was leaving.

## 2012-09-02 ENCOUNTER — Emergency Department (HOSPITAL_COMMUNITY)
Admission: EM | Admit: 2012-09-02 | Discharge: 2012-09-02 | Disposition: A | Discharge status: Home / Self Care | Payer: Self-pay | Attending: Emergency Medicine | Admitting: Emergency Medicine

## 2012-09-02 ENCOUNTER — Encounter (HOSPITAL_COMMUNITY): Payer: Self-pay | Admitting: Emergency Medicine

## 2012-09-02 DIAGNOSIS — Z87891 Personal history of nicotine dependence: Secondary | ICD-10-CM | POA: Insufficient documentation

## 2012-09-02 DIAGNOSIS — L0100 Impetigo, unspecified: Secondary | ICD-10-CM | POA: Insufficient documentation

## 2012-09-02 DIAGNOSIS — Z8679 Personal history of other diseases of the circulatory system: Secondary | ICD-10-CM | POA: Insufficient documentation

## 2012-09-02 DIAGNOSIS — Z8742 Personal history of other diseases of the female genital tract: Secondary | ICD-10-CM | POA: Insufficient documentation

## 2012-09-02 DIAGNOSIS — Z8614 Personal history of Methicillin resistant Staphylococcus aureus infection: Secondary | ICD-10-CM | POA: Insufficient documentation

## 2012-09-02 DIAGNOSIS — Z79899 Other long term (current) drug therapy: Secondary | ICD-10-CM | POA: Insufficient documentation

## 2012-09-02 DIAGNOSIS — L738 Other specified follicular disorders: Secondary | ICD-10-CM | POA: Insufficient documentation

## 2012-09-02 DIAGNOSIS — L739 Follicular disorder, unspecified: Secondary | ICD-10-CM

## 2012-09-02 DIAGNOSIS — Z87442 Personal history of urinary calculi: Secondary | ICD-10-CM | POA: Insufficient documentation

## 2012-09-02 HISTORY — DX: Carrier or suspected carrier of methicillin resistant Staphylococcus aureus: Z22.322

## 2012-09-02 MED ORDER — HYDROCODONE-ACETAMINOPHEN 5-325 MG PO TABS: 2.0000 | ORAL_TABLET | ORAL | Status: DC | PRN

## 2012-09-02 MED ORDER — SULFAMETHOXAZOLE-TRIMETHOPRIM 800-160 MG PO TABS: 1.0000 | ORAL_TABLET | Freq: Two times a day (BID) | ORAL | Status: DC

## 2012-09-02 MED ORDER — AMOXICILLIN-POT CLAVULANATE 875-125 MG PO TABS: 1.0000 | ORAL_TABLET | Freq: Two times a day (BID) | ORAL | Status: DC

## 2012-09-02 NOTE — ED Notes (Signed)
Pt presents with painful sores to left foot and left forearm which she first noticed 2 days ago. Pt has hx of MRSA and states these symptoms are similar to past outbreaks.

## 2012-09-02 NOTE — Discharge Instructions (Signed)
Folliculitis  Folliculitis is redness, soreness, and swelling (inflammation) of the hair follicles. This condition can occur anywhere on the body. People with weakened immune systems, diabetes, or obesity have a greater risk of getting folliculitis. CAUSES  Bacterial infection. This is the most common cause.  Fungal infection.  Viral infection.  Contact with certain chemicals, especially oils and tars. Long-term folliculitis can result from bacteria that live in the nostrils. The bacteria may trigger multiple outbreaks of folliculitis over time. SYMPTOMS Folliculitis most commonly occurs on the scalp, thighs, legs, back, buttocks, and areas where hair is shaved frequently. An early sign of folliculitis is a small, white or yellow, pus-filled, itchy lesion (pustule). These lesions appear on a red, inflamed follicle. They are usually less than 0.2 inches (5 mm) wide. When there is an infection of the follicle that goes deeper, it becomes a boil or furuncle. A group of closely packed boils creates a larger lesion (carbuncle). Carbuncles tend to occur in hairy, sweaty areas of the body. DIAGNOSIS  Your caregiver can usually tell what is wrong by doing a physical exam. A sample may be taken from one of the lesions and tested in a lab. This can help determine what is causing your folliculitis. TREATMENT  Treatment may include:  Applying warm compresses to the affected areas.  Taking antibiotic medicines orally or applying them to the skin.  Draining the lesions if they contain a large amount of pus or fluid.  Laser hair removal for cases of long-lasting folliculitis. This helps to prevent regrowth of the hair. HOME CARE INSTRUCTIONS  Apply warm compresses to the affected areas as directed by your caregiver.  If antibiotics are prescribed, take them as directed. Finish them even if you start to feel better.  You may take over-the-counter medicines to relieve itching.  Do not shave  irritated skin.  Follow up with your caregiver as directed. SEEK IMMEDIATE MEDICAL CARE IF:   You have increasing redness, swelling, or pain in the affected area.  You have a fever. MAKE SURE YOU:  Understand these instructions.  Will watch your condition.  Will get help right away if you are not doing well or get worse. Document Released: 04/01/2001 Document Revised: 07/23/2011 Document Reviewed: 04/23/2011  [facility] Patient Information 2014 [place], [place].  Impetigo Impetigo is an infection of the skin, most common in babies and children.  CAUSES  It is caused by staphylococcal or streptococcal germs (bacteria). Impetigo can start after any damage to the skin. The damage to the skin may be from things like:   Chickenpox.  Scrapes.  Scratches.  Insect bites (common when children scratch the bite).  Cuts.  Nail biting or chewing. Impetigo is contagious. It can be spread from one person to another. Avoid close skin contact, or sharing towels or clothing. SYMPTOMS  Impetigo usually starts out as small blisters or pustules. Then they turn into tiny yellow-crusted sores (lesions).  There may also be:  Large blisters.  Itching or pain.  Pus.  Swollen lymph glands. With scratching, irritation, or non-treatment, these small areas may get larger. Scratching can cause the germs to get under the fingernails; then scratching another part of the skin can cause the infection to be spread there. DIAGNOSIS  Diagnosis of impetigo is usually made by a physical exam. A skin culture (test to grow bacteria) may be done to prove the diagnosis or to help decide the best treatment.  TREATMENT  Mild impetigo can be treated with prescription antibiotic cream. Oral antibiotic  medicine may be used in more severe cases. Medicines for itching may be used. HOME CARE INSTRUCTIONS   To avoid spreading impetigo to other body areas:  Keep fingernails short and clean.  Avoid  scratching.  Cover infected areas if necessary to keep from scratching.  Gently wash the infected areas with antibiotic soap and water.  Soak crusted areas in warm soapy water using antibiotic soap.  Gently rub the areas to remove crusts. Do not scrub.  Wash hands often to avoid spread this infection.  Keep children with impetigo home from school or daycare until they have used an antibiotic cream for 48 hours (2 days) or oral antibiotic medicine for 24 hours (1 day), and their skin shows significant improvement.  Children may attend school or daycare if they only have a few sores and if the sores can be covered by a bandage or clothing. SEEK MEDICAL CARE IF:   More blisters or sores show up despite treatment.  Other family members get sores.  Rash is not improving after 48 hours (2 days) of treatment. SEEK IMMEDIATE MEDICAL CARE IF:   You see spreading redness or swelling of the skin around the sores.  You see red streaks coming from the sores.  Your child develops a fever of 100.4 F (37.2 C) or higher.  Your child develops a sore throat.  Your child is acting ill (lethargic, sick to their stomach). Document Released: 01/19/2000 Document Revised: 04/15/2011 Document Reviewed: 11/18/2007 [facility] Patient Information 2014 [place], [place].

## 2012-09-02 NOTE — ED Notes (Signed)
Started getting bumps to arms, right foot. Pt has open sores to face also. Hx of MRSA. Denies fevers/n/v/d. C/o pain.

## 2012-09-02 NOTE — ED Provider Notes (Signed)
CSN: [id number]     Arrival date & time 09/02/12  1804 History     First MD Initiated Contact with Patient 09/02/12 1817     Chief Complaint  Patient presents with  . Rash   (Consider location/radiation/quality/duration/timing/severity/associated sxs/prior Treatment) HPI Comments: Patient comes to the ER for evaluation of a rash. Patient reports that the area started as small bumps, but have now become more spread out. Patient reports that there is pain, swelling and redness with small amount of drainage. Patient concerned because she has had multiple numerous outbreaks in the past, this seems similar. Patient complaining of a painful lesion on her chin and cheek as well as arms, right hand and right foot. Pain in the foot is moderate to severe.  Patient is a 22 y.o. female presenting with rash.  Rash Associated symptoms: no fever     Past Medical History  Diagnosis Date  . Migraines   . Seizures   . PID (acute pelvic inflammatory disease)   . Ovarian cyst   . Splenic mass   . Kidney stones   . MRSA (methicillin resistant staph aureus) culture positive    History reviewed. No pertinent past surgical history. History reviewed. No pertinent family history. History  Substance Use Topics  . Smoking status: Former Smoker -- 0.50 packs/day    Types: Cigarettes    Quit date: 08/12/2011  . Smokeless tobacco: Not on file  . Alcohol Use: No     Comment: former   OB History   Grav Para Term Preterm Abortions TAB SAB Ect Mult Living                 Review of Systems  Constitutional: Negative for fever.  Skin: Positive for rash.  All other systems reviewed and are negative.    Allergies  Monosodium glutamate; Red dye; and Tramadol  Home Medications   Current Outpatient Rx  Name  Route  Sig  Dispense  Refill  . Aspirin-Salicylamide-Caffeine (BC HEADACHE POWDER PO)   Oral   Take 1 packet by mouth 3 (three) times daily as needed. For pain         . ibuprofen  (ADVIL,MOTRIN) 200 MG tablet   Oral   Take 1,200 mg by mouth 5 (five) times daily.          BP 109/68  Pulse 69  Temp(Src) 98.1 F (36.7 C) (Oral)  Resp 18  Ht 5\' 3"  (1.6 m)  Wt 140 lb (63.504 kg)  BMI 24.81 kg/m2  SpO2 98%  LMP 08/26/2012 Physical Exam  Constitutional: She is oriented to person, place, and time. She appears well-developed and well-nourished. No distress.  HENT:  Head: Normocephalic and atraumatic.  Right Ear: Hearing normal.  Left Ear: Hearing normal.  Nose: Nose normal.  Mouth/Throat: Oropharynx is clear and moist and mucous membranes are normal.  Eyes: Conjunctivae and EOM are normal. Pupils are equal, round, and reactive to light.  Neck: Normal range of motion. Neck supple.  Cardiovascular: Regular rhythm, S1 normal and S2 normal.  Exam reveals no gallop and no friction rub.   No murmur heard. Pulmonary/Chest: Effort normal and breath sounds normal. No respiratory distress. She exhibits no tenderness.  Abdominal: Soft. Normal appearance and bowel sounds are normal. There is no hepatosplenomegaly. There is no tenderness. There is no rebound, no guarding, no tenderness at McBurney's point and negative Murphy's sign. No hernia.  Musculoskeletal: Normal range of motion.  Neurological: She is alert and oriented to person, place,  and time. She has normal strength. No cranial nerve deficit or sensory deficit. Coordination normal. GCS eye subscore is 4. GCS verbal subscore is 5. GCS motor subscore is 6.  Skin: Skin is warm and dry. No rash noted. No cyanosis.     Multiple small, erythematous bumps as well as ulcerated areas on arms and legs.  Large open slightly draining area dorsal aspect of right hand.  Large open slightly draining area dorsal aspect of right foot  Psychiatric: She has a normal mood and affect. Her speech is normal and behavior is normal. Thought content normal.    ED Course   Procedures (including critical care time)  Labs Reviewed - No  data to display No results found.  Diagnosis: Folliculitis  MDM  Patient presents to the ER for evaluation of rash. Patient has multiple areas on her body that are red, slightly draining. She has a history of MRSA infections. Examination is consistent with folliculitis, possible impetigo. Patient will be treated with multiple antibiotics for broad-spectrum coverage. Patient prescribed Bactrim, Augmentin.  Gilda Crease, MD 09/02/12 [phone]

## 2012-09-03 ENCOUNTER — Emergency Department (HOSPITAL_COMMUNITY)
Admission: EM | Admit: 2012-09-03 | Discharge: 2012-09-03 | Disposition: A | Discharge status: Home / Self Care | Payer: Self-pay | Attending: Emergency Medicine | Admitting: Emergency Medicine

## 2012-09-03 DIAGNOSIS — Z87442 Personal history of urinary calculi: Secondary | ICD-10-CM | POA: Insufficient documentation

## 2012-09-03 DIAGNOSIS — M7989 Other specified soft tissue disorders: Secondary | ICD-10-CM | POA: Insufficient documentation

## 2012-09-03 DIAGNOSIS — L539 Erythematous condition, unspecified: Secondary | ICD-10-CM | POA: Insufficient documentation

## 2012-09-03 DIAGNOSIS — Z8742 Personal history of other diseases of the female genital tract: Secondary | ICD-10-CM | POA: Insufficient documentation

## 2012-09-03 DIAGNOSIS — Z8614 Personal history of Methicillin resistant Staphylococcus aureus infection: Secondary | ICD-10-CM | POA: Insufficient documentation

## 2012-09-03 DIAGNOSIS — Z79899 Other long term (current) drug therapy: Secondary | ICD-10-CM | POA: Insufficient documentation

## 2012-09-03 DIAGNOSIS — Z87891 Personal history of nicotine dependence: Secondary | ICD-10-CM | POA: Insufficient documentation

## 2012-09-03 DIAGNOSIS — Z8679 Personal history of other diseases of the circulatory system: Secondary | ICD-10-CM | POA: Insufficient documentation

## 2012-09-03 DIAGNOSIS — R21 Rash and other nonspecific skin eruption: Secondary | ICD-10-CM | POA: Insufficient documentation

## 2012-09-03 MED ORDER — MUPIROCIN 2 % EX OINT: TOPICAL_OINTMENT | Freq: Three times a day (TID) | CUTANEOUS | Status: DC

## 2012-09-03 MED ORDER — OXYCODONE-ACETAMINOPHEN 5-325 MG PO TABS
1.0000 | ORAL_TABLET | Freq: Once | ORAL | Status: AC
  Administered 2012-09-03: 1 via ORAL
  Filled 2012-09-03: qty 1

## 2012-09-03 MED ORDER — NAPROXEN 500 MG PO TABS: 500.0000 mg | ORAL_TABLET | Freq: Two times a day (BID) | ORAL | Status: DC

## 2012-09-03 MED ORDER — CEFTRIAXONE SODIUM 1 G IJ SOLR
1.0000 g | Freq: Once | INTRAMUSCULAR | Status: AC
  Administered 2012-09-03: 1 g via INTRAMUSCULAR
  Filled 2012-09-03: qty 10

## 2012-09-03 NOTE — ED Provider Notes (Signed)
CSN: [id number]     Arrival date & time 09/03/12  1421 History     First MD Initiated Contact with Patient 09/03/12 1503     Chief Complaint  Patient presents with  . Wound Check   (Consider location/radiation/quality/duration/timing/severity/associated sxs/prior Treatment) HPI Comments: [name] is a 22 y.o. female who presents to the Emergency Department for recheck of multiple skin lesions.  She was seen here on 09/02/12 for same and states the lesions "look worse" and c/o of swelling to her right foot.  She also c/o increased pain and requests additional pain medication.  She denies fever, chills , numbness or weakness  The history is provided by the patient.    Past Medical History  Diagnosis Date  . Migraines   . Seizures   . PID (acute pelvic inflammatory disease)   . Ovarian cyst   . Splenic mass   . Kidney stones   . MRSA (methicillin resistant staph aureus) culture positive    No past surgical history on file. No family history on file. History  Substance Use Topics  . Smoking status: Former Smoker -- 0.50 packs/day    Types: Cigarettes    Quit date: 08/12/2011  . Smokeless tobacco: Not on file  . Alcohol Use: No     Comment: former   OB History   Grav Para Term Preterm Abortions TAB SAB Ect Mult Living                 Review of Systems  Constitutional: Negative for fever, chills, activity change and appetite change.  HENT: Negative for sore throat, facial swelling, trouble swallowing, neck pain and neck stiffness.   Respiratory: Negative for chest tightness, shortness of breath and wheezing.   Musculoskeletal: Negative for back pain.  Skin: Positive for rash. Negative for wound.  Neurological: Negative for dizziness, weakness, numbness and headaches.  All other systems reviewed and are negative.    Allergies  Monosodium glutamate; Red dye; Toradol; and Tramadol  Home Medications   Current Outpatient Rx  Name  Route  Sig  Dispense  Refill   . amoxicillin-clavulanate (AUGMENTIN) 875-125 MG per tablet   Oral   Take 1 tablet by mouth 2 (two) times daily.         . Aspirin-Salicylamide-Caffeine (BC HEADACHE POWDER PO)   Oral   Take 1 packet by mouth 3 (three) times daily as needed. For pain         . HYDROcodone-acetaminophen (NORCO/VICODIN) 5-325 MG per tablet   Oral   Take 2 tablets by mouth every 4 (four) hours as needed for pain.   10 tablet   0   . ibuprofen (ADVIL,MOTRIN) 200 MG tablet   Oral   Take 800 mg by mouth every 6 (six) hours as needed for pain.         Marland Kitchen sulfamethoxazole-trimethoprim (BACTRIM DS,SEPTRA DS) 800-160 MG per tablet   Oral   Take 1 tablet by mouth 2 (two) times daily.          BP 113/82  Pulse 85  Temp(Src) 98.3 F (36.8 C) (Oral)  Resp 20  Wt 140 lb (63.504 kg)  BMI 24.81 kg/m2  SpO2 100%  LMP 08/26/2012 Physical Exam  Nursing note and vitals reviewed. Constitutional: She is oriented to person, place, and time. She appears well-developed and well-nourished. No distress.  HENT:  Head: Normocephalic and atraumatic.  Mouth/Throat: Oropharynx is clear and moist.  Neck: Normal range of motion. Neck supple.  Cardiovascular:  Normal rate, regular rhythm and normal heart sounds.   Pulmonary/Chest: Effort normal and breath sounds normal.  Musculoskeletal: She exhibits tenderness. She exhibits no edema.  Lymphadenopathy:    She has no cervical adenopathy.  Neurological: She is alert and oriented to person, place, and time. She exhibits normal muscle tone. Coordination normal.  Skin: Rash noted. There is erythema.  Scattered open lesions to the  lower legs and feet  Some pustules are present to the LE's.  No surrounding erythema.  No induration or fluctuance    ED Course   Procedures (including critical care time)  Labs Reviewed - No data to display   MDM    Previous Ed chart reviewed by me.    Patient currently taking Bactrim but states she did not get the Augmentin  filled because of a lack of money. She states that she can get the prescription filled tomorrow. I have advised her of the importance of taking both medications as directed. I will also prescribe Bactroban ointment.  Rash appears c/w folliculitis.  Slight edema of the right foot is present. No red streaks. Patient is otherwise well appearing and nontoxic.  Requesting additional pain medication and I have advised that additional pain medication is not indicated.     Gareld Obrecht L. Trisha Mangle, PA-C 09/04/12 2058

## 2012-09-03 NOTE — ED Notes (Signed)
States that she was evaluated last night and prescribed several antibiotics and pain medications for "MRSA."   States that the wounds hurt worse today and look worse.

## 2012-09-04 ENCOUNTER — Encounter (HOSPITAL_COMMUNITY): Payer: Self-pay | Admitting: *Deleted

## 2012-09-04 ENCOUNTER — Emergency Department (HOSPITAL_COMMUNITY)
Admission: EM | Admit: 2012-09-04 | Discharge: 2012-09-04 | Disposition: A | Discharge status: Home / Self Care | Payer: Self-pay | Attending: Emergency Medicine | Admitting: Emergency Medicine

## 2012-09-04 DIAGNOSIS — Z87891 Personal history of nicotine dependence: Secondary | ICD-10-CM | POA: Insufficient documentation

## 2012-09-04 DIAGNOSIS — Z8719 Personal history of other diseases of the digestive system: Secondary | ICD-10-CM | POA: Insufficient documentation

## 2012-09-04 DIAGNOSIS — Z8669 Personal history of other diseases of the nervous system and sense organs: Secondary | ICD-10-CM | POA: Insufficient documentation

## 2012-09-04 DIAGNOSIS — Z8742 Personal history of other diseases of the female genital tract: Secondary | ICD-10-CM | POA: Insufficient documentation

## 2012-09-04 DIAGNOSIS — L089 Local infection of the skin and subcutaneous tissue, unspecified: Secondary | ICD-10-CM | POA: Insufficient documentation

## 2012-09-04 DIAGNOSIS — A4902 Methicillin resistant Staphylococcus aureus infection, unspecified site: Secondary | ICD-10-CM | POA: Insufficient documentation

## 2012-09-04 DIAGNOSIS — Z888 Allergy status to other drugs, medicaments and biological substances status: Secondary | ICD-10-CM | POA: Insufficient documentation

## 2012-09-04 DIAGNOSIS — Z792 Long term (current) use of antibiotics: Secondary | ICD-10-CM | POA: Insufficient documentation

## 2012-09-04 DIAGNOSIS — Z5189 Encounter for other specified aftercare: Secondary | ICD-10-CM

## 2012-09-04 DIAGNOSIS — Z87442 Personal history of urinary calculi: Secondary | ICD-10-CM | POA: Insufficient documentation

## 2012-09-04 LAB — GLUCOSE, CAPILLARY: Glucose-Capillary: 74 mg/dL (ref 70–99)

## 2012-09-04 NOTE — ED Provider Notes (Signed)
CSN: [id number]     Arrival date & time 09/04/12  4098 History     First MD Initiated Contact with Patient 09/04/12 0957     Chief Complaint  Patient presents with  . Foot Pain  . MRSA infection    (Consider location/radiation/quality/duration/timing/severity/associated sxs/prior Treatment) The history is provided by the patient.   [name] is a 22 y.o. female who presents to the ED for recheck of an infection of the right foot. This is her third visit. She was prescribed Bactrim and Augmentin on her first visit but only got the Bactrim. She returned yesterday because she thought the area was worse. She was advised to fill the Rx for the Augmentin. Today she returns stating the area is no better and is more painful. She started the Augmentin last night. She denies fever, chills or abdominal pain.  Past Medical History  Diagnosis Date  . Migraines   . Seizures   . PID (acute pelvic inflammatory disease)   . Ovarian cyst   . Splenic mass   . Kidney stones   . MRSA (methicillin resistant staph aureus) culture positive    History reviewed. No pertinent past surgical history. History reviewed. No pertinent family history. History  Substance Use Topics  . Smoking status: Former Smoker -- 0.50 packs/day    Types: Cigarettes    Quit date: 08/12/2011  . Smokeless tobacco: Not on file  . Alcohol Use: No     Comment: former   OB History   Grav Para Term Preterm Abortions TAB SAB Ect Mult Living                 Review of Systems  Constitutional: Negative for fever, chills, activity change and appetite change.  HENT: Negative for neck pain.   Respiratory: Negative for shortness of breath.   Gastrointestinal: Negative for nausea, vomiting and abdominal pain.  Skin: Positive for wound.  Neurological: Negative for headaches.  Psychiatric/Behavioral: The patient is not nervous/anxious.     Allergies  Monosodium glutamate; Red dye; Toradol; and Tramadol  Home Medications     Current Outpatient Rx  Name  Route  Sig  Dispense  Refill  . amoxicillin-clavulanate (AUGMENTIN) 875-125 MG per tablet   Oral   Take 1 tablet by mouth 2 (two) times daily.         . Aspirin-Salicylamide-Caffeine (BC HEADACHE POWDER PO)   Oral   Take 1 packet by mouth 3 (three) times daily as needed. For pain         . HYDROcodone-acetaminophen (NORCO/VICODIN) 5-325 MG per tablet   Oral   Take 2 tablets by mouth every 4 (four) hours as needed for pain.   10 tablet   0   . ibuprofen (ADVIL,MOTRIN) 200 MG tablet   Oral   Take 800 mg by mouth every 6 (six) hours as needed for pain.         . mupirocin ointment (BACTROBAN) 2 %   Topical   Apply topically 3 (three) times daily.   22 g   0   . naproxen (NAPROSYN) 500 MG tablet   Oral   Take 1 tablet (500 mg total) by mouth 2 (two) times daily with a meal.   20 tablet   0   . sulfamethoxazole-trimethoprim (BACTRIM DS,SEPTRA DS) 800-160 MG per tablet   Oral   Take 1 tablet by mouth 2 (two) times daily.          BP 121/70  Pulse 67  Temp(Src) 98.5 F (36.9 C) (Oral)  Resp 13  Ht 5\' 3"  (1.6 m)  Wt 140 lb (63.504 kg)  BMI 24.81 kg/m2  SpO2 100%  LMP 08/26/2012 Physical Exam  Nursing note and vitals reviewed. Constitutional: She is oriented to person, place, and time. She appears well-developed and well-nourished. No distress.  Eyes: EOM are normal.  Neck: Neck supple.  Cardiovascular: Normal rate.   Pulmonary/Chest: Effort normal.  Abdominal: Soft. There is no tenderness.  Musculoskeletal: Normal range of motion.       Right foot: She exhibits tenderness. She exhibits normal range of motion and no deformity. Swelling: minimal.       Feet:  There is an open area on the right foot with a very small amount of drainage. There is minimal swelling of the foot. There is tenderness on palpation of the dorsal aspect. Strong pedal pulse, adequate circulation. There is minimal erythema without streaking noted.  There  is a small healing area of the right wrist.   Neurological: She is alert and oriented to person, place, and time. No cranial nerve deficit.  Skin: Skin is warm and dry.  There is multiple maculopapular areas of the face, upper and lower extremities. Areas appear as folliculitis   Psychiatric: She has a normal mood and affect. Her behavior is normal.   Results for orders placed during the hospital encounter of 09/04/12 (from the past 24 hour(s))  GLUCOSE, CAPILLARY     Status: None   Collection Time    09/04/12 10:28 AM      Result Value Range   Glucose-Capillary 74  70 - 99 mg/dL    ED Course: Dr. Manus Gunning in to examine the patient.    Procedures MDM  22 y.o. female with infected lesions right foot and right wrist. Will have her continue antibiotics and elevate the foot. There is no signs or symptoms of systemic infection. Patient stable for discharge home.   Medication List    ASK your doctor about these medications       amoxicillin-clavulanate 875-125 MG per tablet  Commonly known as:  AUGMENTIN  Take 1 tablet by mouth 2 (two) times daily.     BC HEADACHE POWDER PO  Take 1 packet by mouth 3 (three) times daily as needed. For pain     HYDROcodone-acetaminophen 5-325 MG per tablet  Commonly known as:  NORCO/VICODIN  Take 2 tablets by mouth every 4 (four) hours as needed for pain.     ibuprofen 200 MG tablet  Commonly known as:  ADVIL,MOTRIN  Take 800 mg by mouth every 6 (six) hours as needed for pain.     mupirocin ointment 2 %  Commonly known as:  BACTROBAN  Apply topically 3 (three) times daily.     sulfamethoxazole-trimethoprim 800-160 MG per tablet  Commonly known as:  BACTRIM DS,SEPTRA DS  Take 1 tablet by mouth 2 (two) times daily.         Janne Napoleon, [place] 09/04/12 1042

## 2012-09-04 NOTE — ED Provider Notes (Signed)
Medical screening examination/treatment/procedure(s) were performed by non-physician practitioner and as supervising physician I was immediately available for consultation/collaboration.  Raeford Razor, MD 09/04/12 [phone]

## 2012-09-04 NOTE — ED Provider Notes (Signed)
Medical screening examination/treatment/procedure(s) were conducted as a shared visit with non-physician practitioner(s) and myself.  I personally evaluated the patient during the encounter  Draining wound to R foot, previous visits for same. Delayed starting antibioitcs. Mild erythema, no streaking, no fluctuance. Nontoxic appearing.   Glynn Octave, MD 09/04/12 [phone]

## 2012-09-04 NOTE — ED Notes (Signed)
Was diagnosed w/MRSA at wound on top of R foot and R wrist.  Was given antibiotic and has taken it as prescribed x 2 days.  States it is getting no better, is oozing and very painful.

## 2012-11-06 ENCOUNTER — Emergency Department (HOSPITAL_COMMUNITY)
Admission: EM | Admit: 2012-11-06 | Discharge: 2012-11-06 | Disposition: A | Discharge status: Home / Self Care | Payer: Self-pay | Attending: Emergency Medicine | Admitting: Emergency Medicine

## 2012-11-06 ENCOUNTER — Encounter (HOSPITAL_COMMUNITY): Payer: Self-pay | Admitting: *Deleted

## 2012-11-06 DIAGNOSIS — Z8614 Personal history of Methicillin resistant Staphylococcus aureus infection: Secondary | ICD-10-CM | POA: Insufficient documentation

## 2012-11-06 DIAGNOSIS — K089 Disorder of teeth and supporting structures, unspecified: Secondary | ICD-10-CM | POA: Insufficient documentation

## 2012-11-06 DIAGNOSIS — K0889 Other specified disorders of teeth and supporting structures: Secondary | ICD-10-CM

## 2012-11-06 DIAGNOSIS — Z87442 Personal history of urinary calculi: Secondary | ICD-10-CM | POA: Insufficient documentation

## 2012-11-06 DIAGNOSIS — Z8742 Personal history of other diseases of the female genital tract: Secondary | ICD-10-CM | POA: Insufficient documentation

## 2012-11-06 DIAGNOSIS — K029 Dental caries, unspecified: Secondary | ICD-10-CM | POA: Insufficient documentation

## 2012-11-06 DIAGNOSIS — Z8669 Personal history of other diseases of the nervous system and sense organs: Secondary | ICD-10-CM | POA: Insufficient documentation

## 2012-11-06 DIAGNOSIS — Z87891 Personal history of nicotine dependence: Secondary | ICD-10-CM | POA: Insufficient documentation

## 2012-11-06 DIAGNOSIS — Z8679 Personal history of other diseases of the circulatory system: Secondary | ICD-10-CM | POA: Insufficient documentation

## 2012-11-06 MED ORDER — AMOXICILLIN 500 MG PO CAPS: 500.0000 mg | ORAL_CAPSULE | Freq: Three times a day (TID) | ORAL | Status: DC

## 2012-11-06 MED ORDER — PROMETHAZINE HCL 25 MG PO TABS: 25.0000 mg | ORAL_TABLET | Freq: Four times a day (QID) | ORAL | Status: DC | PRN

## 2012-11-06 MED ORDER — TRAMADOL HCL 50 MG PO TABS: 50.0000 mg | ORAL_TABLET | Freq: Four times a day (QID) | ORAL | Status: DC | PRN

## 2012-11-06 NOTE — ED Notes (Signed)
Upper lt molar pain, for "long time" worse for 3 days

## 2012-11-07 NOTE — ED Provider Notes (Signed)
CSN: [id number]     Arrival date & time 11/06/12  1352 History   First MD Initiated Contact with Patient 11/06/12 1406     Chief Complaint  Patient presents with  . Dental Pain   (Consider location/radiation/quality/duration/timing/severity/associated sxs/prior Treatment) Patient is a 22 y.o. female presenting with tooth pain. The history is provided by the patient.  Dental Pain Location:  Upper Upper teeth location:  14/LU 1st molar and 15/LU 2nd molar Quality:  Throbbing Severity:  Moderate Onset quality:  Gradual Duration:  3 days Timing:  Constant Progression:  Worsening Chronicity:  New Context: dental caries and poor dentition   Context: not abscess, not recent dental surgery and not trauma   Relieved by:  Nothing Worsened by:  Cold food/drink and hot food/drink Ineffective treatments:  NSAIDs Associated symptoms: no congestion, no difficulty swallowing, no facial pain, no facial swelling, no fever, no gum swelling, no headaches, no neck pain, no neck swelling, no oral bleeding, no oral lesions and no trismus   Risk factors: lack of dental care and smoking   Risk factors: no diabetes     Past Medical History  Diagnosis Date  . Migraines   . Seizures   . PID (acute pelvic inflammatory disease)   . Ovarian cyst   . Splenic mass   . MRSA (methicillin resistant staph aureus) culture positive   . Kidney stones    History reviewed. No pertinent past surgical history. History reviewed. No pertinent family history. History  Substance Use Topics  . Smoking status: Former Smoker -- 0.50 packs/day    Types: Cigarettes    Quit date: 08/12/2011  . Smokeless tobacco: Not on file  . Alcohol Use: No     Comment: former   OB History   Grav Para Term Preterm Abortions TAB SAB Ect Mult Living                 Review of Systems  Constitutional: Negative for fever and appetite change.  HENT: Positive for dental problem. Negative for congestion, sore throat, facial swelling,  mouth sores, trouble swallowing, neck pain and neck stiffness.   Eyes: Negative for pain and visual disturbance.  Neurological: Negative for dizziness, facial asymmetry and headaches.  Hematological: Negative for adenopathy.  All other systems reviewed and are negative.    Allergies  Monosodium glutamate; Red dye; Toradol; and Tramadol  Home Medications   Current Outpatient Rx  Name  Route  Sig  Dispense  Refill  . ibuprofen (ADVIL,MOTRIN) 200 MG tablet   Oral   Take 1,600 mg by mouth 5 (five) times daily as needed for pain.          Marland Kitchen amoxicillin (AMOXIL) 500 MG capsule   Oral   Take 1 capsule (500 mg total) by mouth 3 (three) times daily.   30 capsule   0   . promethazine (PHENERGAN) 25 MG tablet   Oral   Take 1 tablet (25 mg total) by mouth every 6 (six) hours as needed for nausea.   12 tablet   0   . traMADol (ULTRAM) 50 MG tablet   Oral   Take 1 tablet (50 mg total) by mouth every 6 (six) hours as needed for pain.   20 tablet   0    BP 120/70  Pulse 69  Temp(Src) 98.3 F (36.8 C) (Oral)  Resp 14  Ht 5\' 3"  (1.6 m)  Wt 140 lb (63.504 kg)  BMI 24.81 kg/m2  SpO2 100%  LMP 10/23/2012  Physical Exam  Nursing note and vitals reviewed. Constitutional: She is oriented to person, place, and time. She appears well-developed and well-nourished. No distress.  HENT:  Head: Normocephalic and atraumatic.  Right Ear: Tympanic membrane and ear canal normal.  Left Ear: Tympanic membrane and ear canal normal.  Mouth/Throat: Uvula is midline, oropharynx is clear and moist and mucous membranes are normal. No trismus in the jaw. Dental caries present. No dental abscesses or edematous.  Dental caries of the left upper molars  No facial swelling, obvious dental abscess, trismus, or sublingual abnml.    Neck: Normal range of motion. Neck supple.  Cardiovascular: Normal rate, regular rhythm and normal heart sounds.   No murmur heard. Pulmonary/Chest: Effort normal and breath  sounds normal. No respiratory distress.  Abdominal: Soft. She exhibits no distension. There is no tenderness. There is no rebound and no guarding.  Musculoskeletal: Normal range of motion.  Lymphadenopathy:    She has no cervical adenopathy.  Neurological: She is alert and oriented to person, place, and time. She exhibits normal muscle tone. Coordination normal.  Skin: Skin is warm and dry.    ED Course  Procedures (including critical care time) Labs Review Labs Reviewed - No data to display Imaging Review No results found.  MDM   1. Pain, dental     Patient reportedly taking up to 1600 mg of ibuprofen several times a day for the dental pain.  She denies abdominal pain, or vomiting.  I have strongly advised pt to take pepcid or PPI and to d/c the ibuprofen.  Discussed risks of excessive NSAID use.  Pt verbalized understanding and agrees to care plan.  VSS.  She appears stable for d/c    Chrisean Kloth L. Trisha Mangle, PA-C 11/07/12 2053

## 2012-11-10 NOTE — ED Provider Notes (Signed)
Medical screening examination/treatment/procedure(s) were performed by non-physician practitioner and as supervising physician I was immediately available for consultation/collaboration.   Ashish Rossetti W Prabhleen Montemayor, MD 11/10/12 0015 

## 2012-11-26 ENCOUNTER — Emergency Department (HOSPITAL_COMMUNITY)
Admission: EM | Admit: 2012-11-26 | Discharge: 2012-11-27 | Disposition: A | Discharge status: Home / Self Care | Payer: Self-pay | Attending: Emergency Medicine | Admitting: Emergency Medicine

## 2012-11-26 ENCOUNTER — Encounter (HOSPITAL_COMMUNITY): Payer: Self-pay | Admitting: Emergency Medicine

## 2012-11-26 ENCOUNTER — Emergency Department (HOSPITAL_COMMUNITY): Payer: Self-pay

## 2012-11-26 DIAGNOSIS — R109 Unspecified abdominal pain: Secondary | ICD-10-CM | POA: Insufficient documentation

## 2012-11-26 DIAGNOSIS — N390 Urinary tract infection, site not specified: Secondary | ICD-10-CM | POA: Insufficient documentation

## 2012-11-26 DIAGNOSIS — Z8679 Personal history of other diseases of the circulatory system: Secondary | ICD-10-CM | POA: Insufficient documentation

## 2012-11-26 DIAGNOSIS — Z87891 Personal history of nicotine dependence: Secondary | ICD-10-CM | POA: Insufficient documentation

## 2012-11-26 DIAGNOSIS — Z8739 Personal history of other diseases of the musculoskeletal system and connective tissue: Secondary | ICD-10-CM | POA: Insufficient documentation

## 2012-11-26 DIAGNOSIS — Z3202 Encounter for pregnancy test, result negative: Secondary | ICD-10-CM | POA: Insufficient documentation

## 2012-11-26 DIAGNOSIS — Z8669 Personal history of other diseases of the nervous system and sense organs: Secondary | ICD-10-CM | POA: Insufficient documentation

## 2012-11-26 DIAGNOSIS — Z8614 Personal history of Methicillin resistant Staphylococcus aureus infection: Secondary | ICD-10-CM | POA: Insufficient documentation

## 2012-11-26 DIAGNOSIS — Z87442 Personal history of urinary calculi: Secondary | ICD-10-CM | POA: Insufficient documentation

## 2012-11-26 MED ORDER — SODIUM CHLORIDE 0.9 % IV SOLN
1000.0000 mL | Freq: Once | INTRAVENOUS | Status: AC
  Administered 2012-11-26: 1000 mL via INTRAVENOUS

## 2012-11-26 MED ORDER — ONDANSETRON HCL 4 MG/2ML IJ SOLN
4.0000 mg | Freq: Once | INTRAMUSCULAR | Status: AC
Start: 2012-11-26 — End: 2012-11-26
  Administered 2012-11-26: 4 mg via INTRAVENOUS
  Filled 2012-11-26: qty 2

## 2012-11-26 MED ORDER — SODIUM CHLORIDE 0.9 % IV SOLN
1000.0000 mL | INTRAVENOUS | Status: DC
  Administered 2012-11-27: 1000 mL via INTRAVENOUS

## 2012-11-26 MED ORDER — HYDROMORPHONE HCL PF 1 MG/ML IJ SOLN
1.0000 mg | Freq: Once | INTRAMUSCULAR | Status: AC
  Administered 2012-11-26: 1 mg via INTRAVENOUS
  Filled 2012-11-26: qty 1

## 2012-11-26 NOTE — ED Notes (Signed)
Patient complaining of left flank pain and nausea x 4 days.

## 2012-11-26 NOTE — ED Provider Notes (Addendum)
CSN: [id number]     Arrival date & time 11/26/12  2155 History  This chart was scribed for Dione Booze, MD by Bennett Scrape, ED Scribe. This patient was seen in room [facility] and the patient's care was started at 11:33 PM.    Chief Complaint  Patient presents with  . Flank Pain  . Nausea    The history is provided by the patient. No language interpreter was used.    HPI Comments: [name] is a 22 y.o. female with a h/o kidney stones who presents to the Emergency Department complaining of left flank pain that has been felt intermittently for the past 4 days. She describes the pain as a stabbing sensation and rates her pain a 7 out of 10 currently. She lists nausea and emesis as associated symptoms Last episode of emesis was earlier tonight. She states that the pain is elevated to a 10 out of 10 approximately 5 to 10 minutes after urination. She otherwise denies any other modifying factors stating that she just can't get comfortable. She states that she has taken ibuprofen and AZO pills and has drank cranberry juice with no improvement. She denies diarrhea, dysuria, fevers, chills, hematuria and decreased urine output.   No current PCP  Past Medical History  Diagnosis Date  . Migraines   . Seizures   . PID (acute pelvic inflammatory disease)   . Ovarian cyst   . Splenic mass   . MRSA (methicillin resistant staph aureus) culture positive   . Kidney stones    History reviewed. No pertinent past surgical history. History reviewed. No pertinent family history. History  Substance Use Topics  . Smoking status: Former Smoker -- 0.50 packs/day    Types: Cigarettes    Quit date: 08/12/2011  . Smokeless tobacco: Not on file  . Alcohol Use: No     Comment: former   No OB history provided.  Review of Systems  Constitutional: Negative for fever and chills.  Gastrointestinal: Positive for nausea and vomiting. Negative for abdominal pain and diarrhea.  Genitourinary: Positive  for flank pain. Negative for dysuria, frequency, hematuria and decreased urine volume.  All other systems reviewed and are negative.    Allergies  Monosodium glutamate; Red dye; Toradol; and Tramadol-causes migraines per pt  Home Medications   Current Outpatient Rx  Name  Route  Sig  Dispense  Refill  . ibuprofen (ADVIL,MOTRIN) 200 MG tablet   Oral   Take 1,600 mg by mouth 5 (five) times daily as needed for pain.          Marland Kitchen amoxicillin (AMOXIL) 500 MG capsule   Oral   Take 1 capsule (500 mg total) by mouth 3 (three) times daily.   30 capsule   0    Triage Vitals: BP 127/70  Pulse 95  Temp(Src) 98.5 F (36.9 C) (Oral)  Resp 20  Ht 5\' 3"  (1.6 m)  Wt 135 lb (61.236 kg)  BMI 23.92 kg/m2  SpO2 98%  LMP 11/03/2012  Physical Exam  Nursing note and vitals reviewed. Constitutional: She is oriented to person, place, and time. She appears well-developed and well-nourished. No distress.  HENT:  Head: Normocephalic and atraumatic.  Eyes: EOM are normal.  Neck: Neck supple. No tracheal deviation present.  Cardiovascular: Normal rate and regular rhythm.   Pulmonary/Chest: Effort normal and breath sounds normal. No respiratory distress.  Abdominal: Soft. There is tenderness (moderate LLQ tenderness). There is no rebound and no guarding.  Moderate left CVA tenderness, bowel  sounds decreased  Musculoskeletal: Normal range of motion.  Neurological: She is alert and oriented to person, place, and time.  Skin: Skin is warm and dry.  Psychiatric: She has a normal mood and affect. Her behavior is normal.    ED Course  Procedures (including critical care time)  Medications  0.9 %  sodium chloride infusion (not administered)    Followed by  0.9 %  sodium chloride infusion (not administered)  HYDROmorphone (DILAUDID) injection 1 mg (not administered)  ondansetron (ZOFRAN) injection 4 mg (not administered)    DIAGNOSTIC STUDIES: Oxygen Saturation is 98% on room air, normal by my  interpretation.    COORDINATION OF CARE: 11:36 PM-Reviewed pt's prior CT scans from 1 year and 1.5 years ago which did not show any stones at the time in the room with the pt. Discussed treatment plan which includes CT of abdomen and UA with pt at bedside and pt agreed to plan.   Labs Review Results for orders placed during the hospital encounter of 11/26/12  URINALYSIS, ROUTINE W REFLEX MICROSCOPIC      Result Value Range   Color, Urine ORANGE (*) YELLOW   APPearance HAZY (*) CLEAR   Specific Gravity, Urine 1.020  1.005 - 1.030   pH 5.5  5.0 - 8.0   Glucose, UA 250 (*) NEGATIVE mg/dL   Hgb urine dipstick NEGATIVE  NEGATIVE   Bilirubin Urine SMALL (*) NEGATIVE   Ketones, ur TRACE (*) NEGATIVE mg/dL   Protein, ur 30 (*) NEGATIVE mg/dL   Urobilinogen, UA >1.6 (*) 0.0 - 1.0 mg/dL   Nitrite POSITIVE (*) NEGATIVE   Leukocytes, UA TRACE (*) NEGATIVE  PREGNANCY, URINE      Result Value Range   Preg Test, Ur NEGATIVE  NEGATIVE  URINE MICROSCOPIC-ADD ON      Result Value Range   Squamous Epithelial / LPF MANY (*) RARE   WBC, UA 0-2  <3 WBC/hpf   RBC / HPF 7-10  <3 RBC/hpf   Bacteria, UA MANY (*) RARE   Imaging Review Ct Abdomen Pelvis Wo Contrast  11/27/2012   CLINICAL DATA:  Left flank pain, nausea and vomiting for 4 days.  EXAM: CT ABDOMEN AND PELVIS WITHOUT CONTRAST  TECHNIQUE: Multidetector CT imaging of the abdomen and pelvis was performed following the standard protocol without intravenous contrast.  COMPARISON:  11/20/2011  FINDINGS: The lung bases are clear.  The kidneys appear symmetrical in size and shape. No pyelocaliectasis or ureterectasis. No renal, ureteral, or bladder stones. No bladder wall thickening.  The unenhanced appearance of the liver, spleen, gallbladder, pancreas, adrenal glands, abdominal aorta, inferior vena cava, and retroperitoneal lymph nodes is unremarkable. The stomach, small bowel, and colon are not abnormally distended. No wall thickening is appreciated  given the degree of distention. No free air or free fluid in the abdomen. Abdominal wall musculature appears intact. Note that the lesion seen in the spleen on previous examination is not well visualized on unenhanced images.  Pelvis: The appendix is not identified. Uterus and ovaries are not enlarged. No free or loculated pelvic fluid collections. No significant pelvic lymphadenopathy. Normal alignment of the lumbar spine.  IMPRESSION: No renal or ureteral stone or obstruction is identified.   Electronically Signed   By: Burman Nieves M.D.   On: 11/27/2012 02:06   Images viewed by me.  MDM   1. Urinary tract infection   2. Left flank pain    Left flank pain. Old records are reviewed and she had CT  scans done 1 year ago showing no evidence of nephrolithiasis. It's unlikely that any stones could have developed in the interim however she will be sent for CT scan and urinalysis will be obtained. She's given IV fluids, hydromorphone, and ondansetron.  Following above noted treatment, she feels significantly better. CT is indeed negative for evidence of ureteral calculi and hydronephrosis. UA is significant for pyuria and positive nitrite. She's given a dose of ceftriaxone and is discharged with prescriptions for cephalexin and oxycodone-acetaminophen.  I personally performed the services described in this documentation, which was scribed in my presence. The recorded information has been reviewed and is accurate.       Dione Booze, MD 11/27/12 1610  Dione Booze, MD 11/27/12 [phone]

## 2012-11-27 LAB — URINALYSIS, ROUTINE W REFLEX MICROSCOPIC
Hgb urine dipstick: NEGATIVE
Protein, ur: 30 mg/dL — AB
Urobilinogen, UA: >8 mg/dL — ABNORMAL HIGH (ref 0.0–1.0)

## 2012-11-27 LAB — URINE MICROSCOPIC-ADD ON

## 2012-11-27 MED ORDER — OXYCODONE-ACETAMINOPHEN 5-325 MG PO TABS: 1.0000 | ORAL_TABLET | ORAL | Status: DC | PRN

## 2012-11-27 MED ORDER — DEXTROSE 5 % IV SOLN
1.0000 g | Freq: Once | INTRAVENOUS | Status: AC
  Administered 2012-11-27: 1 g via INTRAVENOUS
  Filled 2012-11-27: qty 10

## 2012-11-27 MED ORDER — CEPHALEXIN 500 MG PO CAPS: 500.0000 mg | ORAL_CAPSULE | Freq: Four times a day (QID) | ORAL | Status: DC

## 2012-11-27 MED ORDER — HYDROMORPHONE HCL PF 1 MG/ML IJ SOLN
1.0000 mg | Freq: Once | INTRAMUSCULAR | Status: AC
  Administered 2012-11-27: 1 mg via INTRAVENOUS
  Filled 2012-11-27: qty 1

## 2012-11-30 MED FILL — Oxycodone w/ Acetaminophen Tab 5-325 MG: ORAL | Qty: 6 | Status: AC

## 2013-06-26 ENCOUNTER — Encounter (HOSPITAL_COMMUNITY): Payer: Self-pay | Admitting: Emergency Medicine

## 2013-06-26 ENCOUNTER — Emergency Department (HOSPITAL_COMMUNITY)
Admission: EM | Admit: 2013-06-26 | Discharge: 2013-06-27 | Disposition: A | Discharge status: Rehabilitation Facility | Payer: Self-pay | Attending: Emergency Medicine | Admitting: Emergency Medicine

## 2013-06-26 DIAGNOSIS — F329 Major depressive disorder, single episode, unspecified: Secondary | ICD-10-CM

## 2013-06-26 DIAGNOSIS — Z87442 Personal history of urinary calculi: Secondary | ICD-10-CM | POA: Insufficient documentation

## 2013-06-26 DIAGNOSIS — N390 Urinary tract infection, site not specified: Secondary | ICD-10-CM | POA: Insufficient documentation

## 2013-06-26 DIAGNOSIS — Z8679 Personal history of other diseases of the circulatory system: Secondary | ICD-10-CM | POA: Insufficient documentation

## 2013-06-26 DIAGNOSIS — F32A Depression, unspecified: Secondary | ICD-10-CM

## 2013-06-26 DIAGNOSIS — Z8719 Personal history of other diseases of the digestive system: Secondary | ICD-10-CM | POA: Insufficient documentation

## 2013-06-26 DIAGNOSIS — F19939 Other psychoactive substance use, unspecified with withdrawal, unspecified: Secondary | ICD-10-CM | POA: Insufficient documentation

## 2013-06-26 DIAGNOSIS — Z8614 Personal history of Methicillin resistant Staphylococcus aureus infection: Secondary | ICD-10-CM | POA: Insufficient documentation

## 2013-06-26 DIAGNOSIS — F1120 Opioid dependence, uncomplicated: Secondary | ICD-10-CM | POA: Insufficient documentation

## 2013-06-26 DIAGNOSIS — F1123 Opioid dependence with withdrawal: Secondary | ICD-10-CM

## 2013-06-26 DIAGNOSIS — Z3202 Encounter for pregnancy test, result negative: Secondary | ICD-10-CM | POA: Insufficient documentation

## 2013-06-26 DIAGNOSIS — Z79899 Other long term (current) drug therapy: Secondary | ICD-10-CM | POA: Insufficient documentation

## 2013-06-26 DIAGNOSIS — Z87891 Personal history of nicotine dependence: Secondary | ICD-10-CM | POA: Insufficient documentation

## 2013-06-26 DIAGNOSIS — F1193 Opioid use, unspecified with withdrawal: Secondary | ICD-10-CM

## 2013-06-26 DIAGNOSIS — Z8669 Personal history of other diseases of the nervous system and sense organs: Secondary | ICD-10-CM | POA: Insufficient documentation

## 2013-06-26 DIAGNOSIS — F3289 Other specified depressive episodes: Secondary | ICD-10-CM | POA: Insufficient documentation

## 2013-06-26 DIAGNOSIS — Z8742 Personal history of other diseases of the female genital tract: Secondary | ICD-10-CM | POA: Insufficient documentation

## 2013-06-26 DIAGNOSIS — E876 Hypokalemia: Secondary | ICD-10-CM | POA: Insufficient documentation

## 2013-06-26 DIAGNOSIS — R45851 Suicidal ideations: Secondary | ICD-10-CM | POA: Insufficient documentation

## 2013-06-26 LAB — CBC WITH DIFFERENTIAL/PLATELET
Basophils Absolute: 0 10*3/uL (ref 0.0–0.1)
Basophils Relative: 0 % (ref 0–1)
EOS ABS: 0.4 10*3/uL (ref 0.0–0.7)
Eosinophils Relative: 3 % (ref 0–5)
HCT: 38.2 % (ref 36.0–46.0)
Hemoglobin: 12.3 g/dL (ref 12.0–15.0)
LYMPHS ABS: 1.4 10*3/uL (ref 0.7–4.0)
Lymphocytes Relative: 11 % — ABNORMAL LOW (ref 12–46)
MCH: 26.5 pg (ref 26.0–34.0)
MCHC: 32.2 g/dL (ref 30.0–36.0)
MCV: 82.2 fL (ref 78.0–100.0)
MONO ABS: 0.9 10*3/uL (ref 0.1–1.0)
Monocytes Relative: 7 % (ref 3–12)
NEUTROS PCT: 79 % — AB (ref 43–77)
Neutro Abs: 10.1 10*3/uL — ABNORMAL HIGH (ref 1.7–7.7)
PLATELETS: 269 10*3/uL (ref 150–400)
RBC: 4.65 MIL/uL (ref 3.87–5.11)
RDW: 14.4 % (ref 11.5–15.5)
WBC: 12.8 10*3/uL — ABNORMAL HIGH (ref 4.0–10.5)

## 2013-06-26 LAB — COMPREHENSIVE METABOLIC PANEL
ALBUMIN: 3.3 g/dL — AB (ref 3.5–5.2)
ALT: 6 U/L (ref 0–35)
AST: 10 U/L (ref 0–37)
Alkaline Phosphatase: 105 U/L (ref 39–117)
BUN: 4 mg/dL — AB (ref 6–23)
CALCIUM: 9.6 mg/dL (ref 8.4–10.5)
CO2: 27 mEq/L (ref 19–32)
Chloride: 98 mEq/L (ref 96–112)
Creatinine, Ser: 0.56 mg/dL (ref 0.50–1.10)
GFR calc non Af Amer: >90 mL/min (ref >90)
GLUCOSE: 87 mg/dL (ref 70–99)
POTASSIUM: 3.2 meq/L — AB (ref 3.7–5.3)
SODIUM: 138 meq/L (ref 137–147)
TOTAL PROTEIN: 8.2 g/dL (ref 6.0–8.3)
Total Bilirubin: 0.3 mg/dL (ref 0.3–1.2)

## 2013-06-26 LAB — RAPID URINE DRUG SCREEN, HOSP PERFORMED
AMPHETAMINES: NOT DETECTED
BARBITURATES: NOT DETECTED
Benzodiazepines: NOT DETECTED
COCAINE: NOT DETECTED
OPIATES: NOT DETECTED
TETRAHYDROCANNABINOL: NOT DETECTED

## 2013-06-26 LAB — URINALYSIS, ROUTINE W REFLEX MICROSCOPIC
BILIRUBIN URINE: NEGATIVE
GLUCOSE, UA: NEGATIVE mg/dL
HGB URINE DIPSTICK: NEGATIVE
Ketones, ur: NEGATIVE mg/dL
Nitrite: NEGATIVE
PROTEIN: NEGATIVE mg/dL
Specific Gravity, Urine: <1.005 — ABNORMAL LOW (ref 1.005–1.030)
UROBILINOGEN UA: 0.2 mg/dL (ref 0.0–1.0)
pH: 6 (ref 5.0–8.0)

## 2013-06-26 LAB — ETHANOL: Alcohol, Ethyl (B): <11 mg/dL (ref 0–11)

## 2013-06-26 LAB — POC URINE PREG, ED
Preg Test, Ur: NEGATIVE
Preg Test, Ur: NEGATIVE

## 2013-06-26 LAB — URINE MICROSCOPIC-ADD ON

## 2013-06-26 MED ORDER — CLONIDINE HCL 0.1 MG PO TABS: 0.1000 mg | ORAL_TABLET | Freq: Every day | ORAL | Status: DC

## 2013-06-26 MED ORDER — CLONIDINE HCL 0.1 MG PO TABS
0.1000 mg | ORAL_TABLET | Freq: Four times a day (QID) | ORAL | Status: DC
  Administered 2013-06-26 (×2): 0.1 mg via ORAL
  Filled 2013-06-26 (×2): qty 1

## 2013-06-26 MED ORDER — ACETAMINOPHEN 325 MG PO TABS
650.0000 mg | ORAL_TABLET | ORAL | Status: DC | PRN
  Administered 2013-06-26: 650 mg via ORAL
  Filled 2013-06-26: qty 2

## 2013-06-26 MED ORDER — NICOTINE 21 MG/24HR TD PT24
21.0000 mg | MEDICATED_PATCH | Freq: Every day | TRANSDERMAL | Status: DC
  Administered 2013-06-26: 21 mg via TRANSDERMAL
  Filled 2013-06-26: qty 1

## 2013-06-26 MED ORDER — DICYCLOMINE HCL 20 MG PO TABS
20.0000 mg | ORAL_TABLET | Freq: Four times a day (QID) | ORAL | Status: DC | PRN
  Administered 2013-06-26: 20 mg via ORAL
  Filled 2013-06-26: qty 1

## 2013-06-26 MED ORDER — METHOCARBAMOL 500 MG PO TABS
1000.0000 mg | ORAL_TABLET | Freq: Three times a day (TID) | ORAL | Status: DC | PRN
  Filled 2013-06-26: qty 2

## 2013-06-26 MED ORDER — LOPERAMIDE HCL 2 MG PO CAPS: 2.0000 mg | ORAL_CAPSULE | ORAL | Status: DC | PRN

## 2013-06-26 MED ORDER — POTASSIUM CHLORIDE CRYS ER 20 MEQ PO TBCR
40.0000 meq | EXTENDED_RELEASE_TABLET | Freq: Three times a day (TID) | ORAL | Status: DC
  Administered 2013-06-26 (×2): 40 meq via ORAL
  Filled 2013-06-26 (×2): qty 2

## 2013-06-26 MED ORDER — SULFAMETHOXAZOLE-TMP DS 800-160 MG PO TABS
1.0000 | ORAL_TABLET | Freq: Two times a day (BID) | ORAL | Status: DC
  Administered 2013-06-26: 1 via ORAL
  Filled 2013-06-26: qty 1

## 2013-06-26 MED ORDER — LORAZEPAM 1 MG PO TABS
1.0000 mg | ORAL_TABLET | Freq: Three times a day (TID) | ORAL | Status: DC | PRN
  Administered 2013-06-26: 1 mg via ORAL
  Filled 2013-06-26: qty 1

## 2013-06-26 MED ORDER — HYDROXYZINE HCL 25 MG PO TABS: 50.0000 mg | ORAL_TABLET | Freq: Four times a day (QID) | ORAL | Status: DC | PRN

## 2013-06-26 MED ORDER — NAPROXEN 250 MG PO TABS
500.0000 mg | ORAL_TABLET | Freq: Two times a day (BID) | ORAL | Status: DC | PRN
  Administered 2013-06-26: 500 mg via ORAL
  Filled 2013-06-26: qty 2

## 2013-06-26 MED ORDER — CLONIDINE HCL 0.1 MG PO TABS: 0.1000 mg | ORAL_TABLET | ORAL | Status: DC

## 2013-06-26 MED ORDER — ONDANSETRON 4 MG PO TBDP: 4.0000 mg | ORAL_TABLET | Freq: Four times a day (QID) | ORAL | Status: DC | PRN

## 2013-06-26 NOTE — ED Notes (Signed)
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

## 2013-06-26 NOTE — BH Assessment (Signed)
[facility] Assessment Progress Note Spoke with Dr. Lynelle Doctor and took history of pt, spoke with [facility] staff and scheduled tele assessment for 17:25.
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
## 2013-06-26 NOTE — ED Notes (Addendum)
Pt. Reporting leg cramps, restless legs and tingling in elbows.

## 2013-06-26 NOTE — ED Notes (Signed)
Attempted to call report to [facility], no answer, will call back in 10 minutes.

## 2013-06-26 NOTE — ED Notes (Signed)
Explained to pt. That referrals have been faxed to [facility] and [facility]. Explained to pt. The process going forward and that once she gets placement [facility] will transport her. Explained to pt that she may not receive placement tonight.Pt. Requesting to go outside and smoke cigarette, explained to pt. That pts. Are not allowed to go outside to smoke. Pt. Has nicotine patch on. Pt. Stating, "I just want to smoke, if I could just go home and smoke" Explained to pt. That if she left, the process would have to start all over. Mother spoke with pt. And pt. Agreed to stay at this time.

## 2013-06-26 NOTE — ED Notes (Signed)
Spoke with [place] from [facility], pt. Accepted to [facility]. Pt. Mother confirmed that somebody can pick pt. Up in 3-5 days.

## 2013-06-26 NOTE — Progress Notes (Signed)
The following detox facilities have been contacted regarding bed availability on pts behalf:  [facility]- per Joni Reining beds available, application completed and referral faxed [facility]- per Viviann Spare no female detox beds available [facility]- per Will beds available, referral faxed   [facility] Disposition [facility]

## 2013-06-26 NOTE — Progress Notes (Signed)
MHT spoke with [facility], RN who accept pt on behalf of Dr.Mark Fechtosow.  Faxed contact pt sign acknowledging [facility] will not provide transportation back and will not give suboxone to detox off of opiates.    Blain Pais, MHT/NS
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
## 2013-06-26 NOTE — ED Notes (Signed)
Pt. Left with [facility] to be transported to [facility]

## 2013-06-26 NOTE — ED Notes (Signed)
Waiting for paperwork to be faxed from [facility] and receive admitting provider name.

## 2013-06-26 NOTE — ED Notes (Addendum)
At 2220 Pt. Anxious and agitated. Pt. Took nicotine patch off.  Pt. Reluctant to sign form due to no suboxone policy. Pt. Agrees that she wants help with detox. Talked through process with pt. And mother. Pt. Agreed to sign form at 2250. Form faxed to [facility].

## 2013-06-27 NOTE — ED Notes (Signed)
This RN unable to make contact with [facility].

## 2013-06-27 NOTE — ED Notes (Signed)
Attempted to call report to [facility], no answer.

## 2013-06-27 NOTE — ED Notes (Signed)
Attempted to call [facility], no answer. Faxed paper to them with my number to call to get report.

## 2013-06-27 NOTE — Progress Notes (Signed)
MHT received a call from [place], RN at [facility], pt has been denied due to past seizures.  [facility] reports that they do not take any pts with seizures past or present.  Blain Pais, MHT/NS

## 2013-06-28 LAB — URINE CULTURE: Colony Count: 45000

## 2013-06-29 ENCOUNTER — Telehealth (HOSPITAL_BASED_OUTPATIENT_CLINIC_OR_DEPARTMENT_OTHER): Payer: Self-pay | Admitting: Emergency Medicine

## 2013-06-29 NOTE — Progress Notes (Signed)
ED Antimicrobial Stewardship Positive Culture Follow Up   [name] is an 23 y.o. female who presented to [facility] on 06/26/2013 with a chief complaint of  Chief Complaint  Patient presents with  . V70.1    Recent Results (from the past 720 hour(s))  URINE CULTURE     Status: None   Collection Time    06/26/13  3:40 PM      Result Value Ref Range Status   Specimen Description URINE, CLEAN CATCH   Final   Special Requests NONE   Final   Culture  Setup Time     Final   Value: 06/26/2013 21:18     Performed at [facility] Count     Final   Value: 45,000 COLONIES/ML     Performed at [facility]   Culture     Final   Value: ESCHERICHIA COLI     Performed at [facility]   Report Status 06/28/2013 FINAL   Final   Organism ID, Bacteria ESCHERICHIA COLI   Final    [x]  Treated with Bactrim DS, organism resistant to prescribed antimicrobial []  Patient discharged originally without antimicrobial agent and treatment is now indicated  New antibiotic prescription: Keflex 500 mg PO BID for 10 days.  Stop Bactrim DS.  ED Provider: Christoper Allegra, PA-C   Anabel Bene 06/29/2013, 11:06 AM Infectious Diseases Pharmacist Phone# [phone]

## 2013-06-29 NOTE — Telephone Encounter (Signed)
Post ED Visit - Positive Culture Follow-up: Successful Patient Follow-Up  Culture assessed and recommendations reviewed by: []  Wes Dulaney, Pharm.D., BCPS []  Celedonio Miyamoto, [address].D., BCPS []  Georgina Pillion, [address].D., BCPS []  [place], [address].D., BCPS, AAHIVP []  Estella Husk, Pharm.D., BCPS, AAHIVP [x]  Ofilia Neas, Pharm.D.  Positive urine culture  []  Patient discharged without antimicrobial prescription and treatment is now indicated [x]  Organism is resistant to prescribed ED discharge antimicrobial []  Patient with positive blood cultures  Changes discussed with ED provider: Jaynie Crumble PA-C New antibiotic prescription: Keflex 500 mg BID x 10 days    Beuna Bolding 06/29/2013, 1:23 PM

## 2013-07-10 NOTE — Telephone Encounter (Signed)
Unable to contact patient via phone. Sent letter. °

## 2013-08-03 ENCOUNTER — Emergency Department (HOSPITAL_COMMUNITY)
Admission: EM | Admit: 2013-08-03 | Discharge: 2013-08-04 | Discharge status: Against Medical Advice | Payer: Self-pay | Attending: Emergency Medicine | Admitting: Emergency Medicine

## 2013-08-03 ENCOUNTER — Emergency Department (HOSPITAL_COMMUNITY): Payer: Self-pay

## 2013-08-03 ENCOUNTER — Encounter (HOSPITAL_COMMUNITY): Payer: Self-pay | Admitting: Emergency Medicine

## 2013-08-03 DIAGNOSIS — D649 Anemia, unspecified: Secondary | ICD-10-CM | POA: Insufficient documentation

## 2013-08-03 DIAGNOSIS — Z87891 Personal history of nicotine dependence: Secondary | ICD-10-CM | POA: Insufficient documentation

## 2013-08-03 DIAGNOSIS — Z8679 Personal history of other diseases of the circulatory system: Secondary | ICD-10-CM | POA: Insufficient documentation

## 2013-08-03 DIAGNOSIS — Z8614 Personal history of Methicillin resistant Staphylococcus aureus infection: Secondary | ICD-10-CM | POA: Insufficient documentation

## 2013-08-03 DIAGNOSIS — R059 Cough, unspecified: Secondary | ICD-10-CM | POA: Insufficient documentation

## 2013-08-03 DIAGNOSIS — M7989 Other specified soft tissue disorders: Secondary | ICD-10-CM | POA: Insufficient documentation

## 2013-08-03 DIAGNOSIS — R6883 Chills (without fever): Secondary | ICD-10-CM | POA: Insufficient documentation

## 2013-08-03 DIAGNOSIS — Z8742 Personal history of other diseases of the female genital tract: Secondary | ICD-10-CM | POA: Insufficient documentation

## 2013-08-03 DIAGNOSIS — N390 Urinary tract infection, site not specified: Secondary | ICD-10-CM | POA: Insufficient documentation

## 2013-08-03 DIAGNOSIS — Z3202 Encounter for pregnancy test, result negative: Secondary | ICD-10-CM | POA: Insufficient documentation

## 2013-08-03 DIAGNOSIS — R05 Cough: Secondary | ICD-10-CM | POA: Insufficient documentation

## 2013-08-03 DIAGNOSIS — Z8669 Personal history of other diseases of the nervous system and sense organs: Secondary | ICD-10-CM | POA: Insufficient documentation

## 2013-08-03 DIAGNOSIS — Z87442 Personal history of urinary calculi: Secondary | ICD-10-CM | POA: Insufficient documentation

## 2013-08-03 LAB — URINE MICROSCOPIC-ADD ON

## 2013-08-03 LAB — URINALYSIS, ROUTINE W REFLEX MICROSCOPIC
Glucose, UA: NEGATIVE mg/dL
Hgb urine dipstick: NEGATIVE
Ketones, ur: NEGATIVE mg/dL
Nitrite: NEGATIVE
SPECIFIC GRAVITY, URINE: 1.02 (ref 1.005–1.030)
pH: 5.5 (ref 5.0–8.0)

## 2013-08-03 LAB — BASIC METABOLIC PANEL
BUN: 6 mg/dL (ref 6–23)
CALCIUM: 8.6 mg/dL (ref 8.4–10.5)
CO2: 27 mEq/L (ref 19–32)
CREATININE: 0.55 mg/dL (ref 0.50–1.10)
Chloride: 95 mEq/L — ABNORMAL LOW (ref 96–112)
GFR calc Af Amer: >90 mL/min (ref >90)
Glucose, Bld: 76 mg/dL (ref 70–99)
Potassium: 3.6 mEq/L — ABNORMAL LOW (ref 3.7–5.3)
Sodium: 133 mEq/L — ABNORMAL LOW (ref 137–147)

## 2013-08-03 LAB — CBC WITH DIFFERENTIAL/PLATELET
BASOS ABS: 0.2 10*3/uL — AB (ref 0.0–0.1)
Basophils Relative: 2 % — ABNORMAL HIGH (ref 0–1)
Eosinophils Absolute: 0.2 10*3/uL (ref 0.0–0.7)
Eosinophils Relative: 2 % (ref 0–5)
HCT: 25.9 % — ABNORMAL LOW (ref 36.0–46.0)
Hemoglobin: 9.3 g/dL — ABNORMAL LOW (ref 12.0–15.0)
LYMPHS ABS: 7.6 10*3/uL — AB (ref 0.7–4.0)
Lymphocytes Relative: 60 % — ABNORMAL HIGH (ref 12–46)
MCH: 33.8 pg (ref 26.0–34.0)
MCHC: 35.9 g/dL (ref 30.0–36.0)
MCV: 94.2 fL (ref 78.0–100.0)
Monocytes Absolute: 1 10*3/uL (ref 0.1–1.0)
Monocytes Relative: 8 % (ref 3–12)
Neutro Abs: 3.7 10*3/uL (ref 1.7–7.7)
Neutrophils Relative %: 29 % — ABNORMAL LOW (ref 43–77)
PLATELETS: ADEQUATE 10*3/uL (ref 150–400)
RBC: 2.75 MIL/uL — ABNORMAL LOW (ref 3.87–5.11)
RDW: 23.2 % — AB (ref 11.5–15.5)
WBC: 12.7 10*3/uL — AB (ref 4.0–10.5)

## 2013-08-03 LAB — PREGNANCY, URINE: PREG TEST UR: NEGATIVE

## 2013-08-03 MED ORDER — HYDROCOD POLST-CHLORPHEN POLST 10-8 MG/5ML PO LQCR
5.0000 mL | Freq: Once | ORAL | Status: AC
  Administered 2013-08-03: 5 mL via ORAL
  Filled 2013-08-03: qty 5

## 2013-08-03 MED ORDER — CEFTRIAXONE SODIUM 1 G IJ SOLR
1.0000 g | INTRAMUSCULAR | Status: DC
  Filled 2013-08-03: qty 10

## 2013-08-03 MED ORDER — LIDOCAINE HCL (PF) 1 % IJ SOLN
INTRAMUSCULAR | Status: AC
  Filled 2013-08-03: qty 5

## 2013-08-03 MED ORDER — PRENATAL VITAMINS 0.8 MG PO TABS: 1.0000 | ORAL_TABLET | Freq: Every day | ORAL | Status: DC

## 2013-08-03 MED ORDER — DEXTROSE 5 % IV SOLN: 1.0000 g | Freq: Once | INTRAVENOUS | Status: DC

## 2013-08-03 NOTE — ED Provider Notes (Signed)
CSN: [id number]     Arrival date & time 08/03/13  2135 History  This chart was scribed for non-physician practitioner, Burgess AmorJulie Idol, PA-C, working with Layla MawKristen N Ward, DO by Charline BillsEssence Howell, ED Scribe. This patient was seen in room APA19/APA19 and the patient's care was started at 10:45 PM.   Chief Complaint  Patient presents with  . Leg Swelling  . Cough   The history is provided by the patient. No language interpreter was used.   HPI Comments: [name] is a 23 y.o. female who presents to the Emergency Department complaining of constant mid to lower back pain which has been present for several weeks. She describes the quality of pain as aching. Pt states that she noted a light colored bruise to her mid spine 2 weeks ago. She states that she was [facility] ago and had xrays and lab tests to rule out an epidural abscess which was negative per Ct scans.  She was told her potassium was low and was sent home with potassium pills. Pt also reports productive cough onset a few days ago, chills, chest pain with coughing, intermittent SOB, bilateral leg swelling onset several weeks ago.  Pt denies standing on her legs for long periods of times. She denies dysuria but reports dark colored urine. Pt states that she drinks plenty of fluids. She also denies nausea, abdominal pain, diarrhea, fever, dysuria or vaginal discharge. Pt took a hydrocodone capsule this morning for pain. She reports tobacco use but denies alcohol use. No h/o surgery. Pt has a h/o drug abuse and excessive BC Powder use.  No PCP  Past Medical History  Diagnosis Date  . Migraines   . Seizures   . PID (acute pelvic inflammatory disease)   . Ovarian cyst   . Splenic mass   . MRSA (methicillin resistant staph aureus) culture positive   . Kidney stones    History reviewed. No pertinent past surgical history. History reviewed. No pertinent family history. History  Substance Use Topics  . Smoking  status: Former Smoker -- 0.50 packs/day    Types: Cigarettes    Quit date: 08/12/2011  . Smokeless tobacco: Not on file  . Alcohol Use: No     Comment: former   OB History   Grav Para Term Preterm Abortions TAB SAB Ect Mult Living                 Review of Systems  Constitutional: Positive for chills. Negative for fever.  Respiratory: Positive for cough and shortness of breath.   Cardiovascular: Positive for chest pain and leg swelling.  Gastrointestinal: Negative for nausea, abdominal pain and diarrhea.  Genitourinary: Negative for dysuria.  Musculoskeletal: Positive for back pain and myalgias.  All other systems reviewed and are negative.  Allergies  Monosodium glutamate; Red dye; Toradol; and Tramadol  Home Medications   Prior to Admission medications   Medication Sig Start Date End Date Taking? Authorizing Provider  Aspirin-Salicylamide-Caffeine (BC HEADACHE) 325-95-16 MG TABS Take 1 packet by mouth every 4 (four) hours as needed.   Yes Historical Provider, MD   Triage Vitals: BP 120/69  Pulse 105  Temp(Src) 98.8 F (37.1 C) (Oral)  Resp 18  Ht 5\' 3"  (1.6 m)  Wt 115 lb (52.164 kg)  BMI 20.38 kg/m2  SpO2 100%  LMP 07/13/2013 Physical Exam  Nursing note and vitals reviewed. Constitutional: She appears well-developed and well-nourished.  HENT:  Head: Normocephalic and atraumatic.  Eyes:  Conjunctivae are normal.  Neck: Normal range of motion.  Cardiovascular: Normal rate, regular rhythm, normal heart sounds and intact distal pulses.   Pulmonary/Chest: Effort normal. She has no wheezes. She has no rhonchi. She has no rales.  Coarse breath sounds in all lung fields  Abdominal: Soft. Bowel sounds are normal. There is no tenderness. There is CVA tenderness.  Bilateral cva tenderness.  Musculoskeletal: Normal range of motion. She exhibits edema.       Thoracic back: She exhibits tenderness.       Lumbar back: She exhibits tenderness.  Trace edema in bilateral  ankles Tender midline, lower thoracic and lumbar  Neurological: She is alert.  Skin: Skin is warm and dry.  Psychiatric: She has a normal mood and affect.   ED Course  Procedures (including critical care time) DIAGNOSTIC STUDIES: Oxygen Saturation is 100% on RA, normal by my interpretation.    COORDINATION OF CARE: 11:00 PM-Discussed treatment plan with pt at bedside and pt agreed to plan.   Labs Review Labs Reviewed  URINALYSIS, ROUTINE W REFLEX MICROSCOPIC - Abnormal; Notable for the following:    Bilirubin Urine SMALL (*)    Protein, ur TRACE (*)    Urobilinogen, UA >8.0 (*)    Leukocytes, UA LARGE (*)    All other components within normal limits  BASIC METABOLIC PANEL - Abnormal; Notable for the following:    Sodium 133 (*)    Potassium 3.6 (*)    Chloride 95 (*)    All other components within normal limits  CBC WITH DIFFERENTIAL - Abnormal; Notable for the following:    WBC 12.7 (*)    RBC 2.75 (*)    Hemoglobin 9.3 (*)    HCT 25.9 (*)    RDW 23.2 (*)    Neutrophils Relative % 29 (*)    Lymphocytes Relative 60 (*)    Lymphs Abs 7.6 (*)    Basophils Relative 2 (*)    Basophils Absolute 0.2 (*)    All other components within normal limits  URINE MICROSCOPIC-ADD ON - Abnormal; Notable for the following:    Squamous Epithelial / LPF FEW (*)    Bacteria, UA MANY (*)    Crystals CA OXALATE CRYSTALS (*)    All other components within normal limits  HEPATIC FUNCTION PANEL - Abnormal; Notable for the following:    Albumin 2.6 (*)    Alkaline Phosphatase 133 (*)    All other components within normal limits  URINE CULTURE  PREGNANCY, URINE   Imaging Review Dg Chest 2 View  08/03/2013   CLINICAL DATA:  Chest pain shortness of breath, lower extremity swelling.  EXAM: CHEST  2 VIEW  COMPARISON:  Chest radiograph October 11, 2011  FINDINGS: Cardiomediastinal silhouette is unremarkable. The lungs are clear without pleural effusions or focal consolidations. Trachea projects  midline and there is no pneumothorax. Soft tissue planes and included osseous structures are non-suspicious.  IMPRESSION: No acute cardiopulmonary process ; normal chest.   Electronically Signed   By: Awilda Metro   On: 08/03/2013 23:22     EKG Interpretation None      MDM   Final diagnoses:  UTI (lower urinary tract infection)  Anemia, unspecified anemia type    Patients labs and/or radiological studies were viewed and considered during the medical decision making and disposition process. I also reviewed the CT's completed at [facility] 10 days ago assessing for possible epidural abscess of L or T spine - these tests were negative and  there were no renal stones found per my review.   Urine culture ordered.  Rocephin 1 gram IM ordered.   RN reported that patient refused Rocephin abx.  While awaiting remaining lab test results (hepatic fxn panel), advised by RN that patient and her family member had left the dept without notifying anyone.  She was not treated for her uti.    I personally performed the services described in this documentation, which was scribed in my presence. The recorded information has been reviewed and is accurate.    Burgess AmorJulie Idol, PA-C 08/04/13 [phone]

## 2013-08-03 NOTE — ED Notes (Addendum)
Pt reports back pain for several weeks.  Also reporting edema in lower extremities for past few days, states that "my legs are really hurting from all the swelling."  Pt reporting productive cough as well and some intermittent SOB.

## 2013-08-03 NOTE — ED Notes (Signed)
Pt states her lower back is hurting mid spine to lower back. Pt denies any injury or activity. Pt also reports productive cough & swelling to lower ext.

## 2013-08-04 LAB — HEPATIC FUNCTION PANEL
ALK PHOS: 133 U/L — AB (ref 39–117)
ALT: 17 U/L (ref 0–35)
AST: 32 U/L (ref 0–37)
Albumin: 2.6 g/dL — ABNORMAL LOW (ref 3.5–5.2)
Total Bilirubin: 0.7 mg/dL (ref 0.3–1.2)
Total Protein: 7.3 g/dL (ref 6.0–8.3)

## 2013-08-04 NOTE — ED Notes (Signed)
Pt & family member left room. Did not let anyone know they were leaving. PA notified.

## 2013-08-04 NOTE — ED Notes (Signed)
Pt refused injection, PA notified. Pt left department & did not speak to staff before leaving. PA notified of pt leaving.

## 2013-08-04 NOTE — ED Notes (Signed)
Pt refused injection. PA notified.

## 2013-08-04 NOTE — ED Provider Notes (Signed)
Medical screening examination/treatment/procedure(s) were performed by non-physician practitioner and as supervising physician I was immediately available for consultation/collaboration.   EKG Interpretation None        Enid SkeensJoshua M Pasqual Farias, MD 08/04/13 [phone]

## 2013-08-05 LAB — URINE CULTURE
COLONY COUNT: NO GROWTH
Culture: NO GROWTH

## 2013-08-28 ENCOUNTER — Encounter (HOSPITAL_COMMUNITY): Payer: Self-pay | Admitting: Emergency Medicine

## 2013-08-28 ENCOUNTER — Emergency Department (HOSPITAL_COMMUNITY)
Admission: EM | Admit: 2013-08-28 | Discharge: 2013-08-28 | Discharge status: Against Medical Advice | Payer: Self-pay | Attending: Emergency Medicine | Admitting: Emergency Medicine

## 2013-08-28 DIAGNOSIS — Z8742 Personal history of other diseases of the female genital tract: Secondary | ICD-10-CM | POA: Insufficient documentation

## 2013-08-28 DIAGNOSIS — Z87442 Personal history of urinary calculi: Secondary | ICD-10-CM | POA: Insufficient documentation

## 2013-08-28 DIAGNOSIS — Z87891 Personal history of nicotine dependence: Secondary | ICD-10-CM | POA: Insufficient documentation

## 2013-08-28 DIAGNOSIS — Z3202 Encounter for pregnancy test, result negative: Secondary | ICD-10-CM | POA: Insufficient documentation

## 2013-08-28 DIAGNOSIS — R109 Unspecified abdominal pain: Secondary | ICD-10-CM | POA: Insufficient documentation

## 2013-08-28 DIAGNOSIS — G8929 Other chronic pain: Secondary | ICD-10-CM | POA: Insufficient documentation

## 2013-08-28 DIAGNOSIS — Z8679 Personal history of other diseases of the circulatory system: Secondary | ICD-10-CM | POA: Insufficient documentation

## 2013-08-28 DIAGNOSIS — Z8614 Personal history of Methicillin resistant Staphylococcus aureus infection: Secondary | ICD-10-CM | POA: Insufficient documentation

## 2013-08-28 LAB — URINALYSIS, ROUTINE W REFLEX MICROSCOPIC
BILIRUBIN URINE: NEGATIVE
Glucose, UA: NEGATIVE mg/dL
Hgb urine dipstick: NEGATIVE
Ketones, ur: NEGATIVE mg/dL
LEUKOCYTES UA: NEGATIVE
Nitrite: NEGATIVE
PH: 7.5 (ref 5.0–8.0)
Protein, ur: NEGATIVE mg/dL
Specific Gravity, Urine: 1.01 (ref 1.005–1.030)
Urobilinogen, UA: 0.2 mg/dL (ref 0.0–1.0)

## 2013-08-28 LAB — RAPID URINE DRUG SCREEN, HOSP PERFORMED
Amphetamines: NOT DETECTED
Barbiturates: POSITIVE — AB
Benzodiazepines: NOT DETECTED
COCAINE: NOT DETECTED
OPIATES: NOT DETECTED
TETRAHYDROCANNABINOL: POSITIVE — AB

## 2013-08-28 LAB — PREGNANCY, URINE: Preg Test, Ur: NEGATIVE

## 2013-08-28 MED ORDER — IBUPROFEN 800 MG PO TABS
ORAL_TABLET | ORAL | Status: AC
  Filled 2013-08-28: qty 1

## 2013-08-28 MED ORDER — IBUPROFEN 800 MG PO TABS
800.0000 mg | ORAL_TABLET | Freq: Once | ORAL | Status: AC
  Administered 2013-08-28: 800 mg via ORAL

## 2013-08-28 MED ORDER — IBUPROFEN 800 MG PO TABS
800.0000 mg | ORAL_TABLET | Freq: Once | ORAL | Status: DC
  Filled 2013-08-28: qty 1

## 2013-08-28 NOTE — ED Notes (Signed)
ERMD at bedside at this time 

## 2013-08-28 NOTE — ED Notes (Signed)
Patient arrives POV c/o lower abd and lower back pain. Patient states "it feels like a kidney stone" patient also c/o congestion with a cough

## 2013-08-28 NOTE — ED Provider Notes (Signed)
CSN: [id number]     Arrival date & time 08/28/13  0319 History   First MD Initiated Contact with Patient 08/28/13 [phone]     Chief Complaint  Patient presents with  . Abdominal Pain     (Consider location/radiation/quality/duration/timing/severity/associated sxs/prior Treatment) HPI This is a 23 year old female with a history of kidney stones and a long-standing history of narcotic abuse. She was seen here on June 30 of this year and diagnosed with a urinary tract infection. She'll lobe without treatment. At that time a CT [facility] on about June 20 was reviewed and showed no kidney stones. Her most recent CT here in October of 2014 also showed no kidney stones.   She is here with a three-day history of suprapubic pain which she describes as severe. She is also complaining of low back pain that has been chronic. Pain is worse with movement. There is no associated weakness of the legs. She states she took a pain pill "from an old prescription I had [facility]" earlier without relief. She is also complaining of chest congestion for the past month. She states it is hard to take a deep breath and she attributes this to the pain. She also states she has been having subjective fever, chills, nasal congestion, scratchy throat and productive cough. She denies nausea, vomiting, diarrhea, vaginal bleeding, vaginal discharge, hematuria or dysuria.  Past Medical History  Diagnosis Date  . Migraines   . Seizures   . PID (acute pelvic inflammatory disease)   . Ovarian cyst   . Splenic mass   . MRSA (methicillin resistant staph aureus) culture positive   . Kidney stones    History reviewed. No pertinent past surgical history. History reviewed. No pertinent family history. History  Substance Use Topics  . Smoking status: Former Smoker -- 0.50 packs/day    Types: Cigarettes    Quit date: 08/12/2011  . Smokeless tobacco: Not on file  . Alcohol Use: No     Comment: former   OB  History   Grav Para Term Preterm Abortions TAB SAB Ect Mult Living                 Review of Systems  All other systems reviewed and are negative.   Allergies  Monosodium glutamate; Red dye; Toradol; and Tramadol  Home Medications   Prior to Admission medications   Medication Sig Start Date End Date Taking? Authorizing Provider  Aspirin-Salicylamide-Caffeine (BC HEADACHE) 325-95-16 MG TABS Take 1 packet by mouth every 4 (four) hours as needed.   Yes Historical Provider, MD   BP 103/65  Pulse 99  Temp(Src) 99 F (37.2 C) (Oral)  Resp 18  Ht 5\' 3"  (1.6 m)  Wt 112 lb (50.803 kg)  BMI 19.84 kg/m2  SpO2 100%  LMP 07/13/2013  Physical Exam General: Well-developed, well-nourished female in no acute distress; appearance consistent with age of record HENT: normocephalic; atraumatic Eyes: pupils equal, round and reactive to light; extraocular muscles intact Neck: supple Heart: regular rate and rhythm Lungs: clear to auscultation bilaterally Abdomen: soft; nondistended; suprapubic tenderness; no masses or hepatosplenomegaly; bowel sounds present GU: Normal external genitalia; vaginal discharge noted; patient unable to tolerate speculum exam, screamed and pulled away when the tip of the speculum was just barely in contact with the introitus, patient then insisted on leaving Back: Generalized low back tenderness with pain on movement of lower back Extremities: No deformity; full range of motion; pulses normal Neurologic: Awake, alert and oriented; motor  function intact in all extremities and symmetric; no facial droop; noted to ambulate and change clothes without difficulty Skin: Warm and dry Psychiatric: Tearful    ED Course  Procedures (including critical care time)   MDM   Nursing notes and vitals signs, including pulse oximetry, reviewed.  Summary of this visit's results, reviewed by myself:  Labs:  Results for orders placed during the hospital encounter of 08/28/13  (from the past 24 hour(s))  URINALYSIS, ROUTINE W REFLEX MICROSCOPIC     Status: None   Collection Time    08/28/13  3:45 AM      Result Value Ref Range   Color, Urine YELLOW  YELLOW   APPearance CLEAR  CLEAR   Specific Gravity, Urine 1.010  1.005 - 1.030   pH 7.5  5.0 - 8.0   Glucose, UA NEGATIVE  NEGATIVE mg/dL   Hgb urine dipstick NEGATIVE  NEGATIVE   Bilirubin Urine NEGATIVE  NEGATIVE   Ketones, ur NEGATIVE  NEGATIVE mg/dL   Protein, ur NEGATIVE  NEGATIVE mg/dL   Urobilinogen, UA 0.2  0.0 - 1.0 mg/dL   Nitrite NEGATIVE  NEGATIVE   Leukocytes, UA NEGATIVE  NEGATIVE  PREGNANCY, URINE     Status: None   Collection Time    08/28/13  3:45 AM      Result Value Ref Range   Preg Test, Ur NEGATIVE  NEGATIVE  URINE RAPID DRUG SCREEN (HOSP PERFORMED)     Status: Abnormal   Collection Time    08/28/13  3:45 AM      Result Value Ref Range   Opiates NONE DETECTED  NONE DETECTED   Cocaine NONE DETECTED  NONE DETECTED   Benzodiazepines NONE DETECTED  NONE DETECTED   Amphetamines NONE DETECTED  NONE DETECTED   Tetrahydrocannabinol POSITIVE (*) NONE DETECTED   Barbiturates POSITIVE (*) NONE DETECTED       Carlisle Beers Marguetta Windish, MD 08/28/13 [phone]

## 2013-08-28 NOTE — ED Notes (Addendum)
Per EDP pt can leave AMA, pt would not allow MD to perform vaginal exam. Pt asked if she had to sign out or can she just walk out. Informed I can not make her sign anything. Pt left the ED on her own w/ no acute distress noted.

## 2013-08-31 ENCOUNTER — Telehealth (HOSPITAL_BASED_OUTPATIENT_CLINIC_OR_DEPARTMENT_OTHER): Payer: Self-pay | Admitting: Emergency Medicine

## 2013-08-31 NOTE — Telephone Encounter (Signed)
No response to letter sent after 30 days. Chart sent to Medical Records. °

## 2014-02-08 ENCOUNTER — Encounter (HOSPITAL_COMMUNITY): Payer: Self-pay | Admitting: Emergency Medicine

## 2014-02-08 ENCOUNTER — Emergency Department (HOSPITAL_COMMUNITY)
Admission: EM | Admit: 2014-02-08 | Discharge: 2014-02-08 | Disposition: A | Discharge status: Home / Self Care | Payer: Self-pay | Attending: Emergency Medicine | Admitting: Emergency Medicine

## 2014-02-08 DIAGNOSIS — Z8742 Personal history of other diseases of the female genital tract: Secondary | ICD-10-CM | POA: Insufficient documentation

## 2014-02-08 DIAGNOSIS — Z8614 Personal history of Methicillin resistant Staphylococcus aureus infection: Secondary | ICD-10-CM | POA: Insufficient documentation

## 2014-02-08 DIAGNOSIS — Z79899 Other long term (current) drug therapy: Secondary | ICD-10-CM | POA: Insufficient documentation

## 2014-02-08 DIAGNOSIS — Z87442 Personal history of urinary calculi: Secondary | ICD-10-CM | POA: Insufficient documentation

## 2014-02-08 DIAGNOSIS — G43909 Migraine, unspecified, not intractable, without status migrainosus: Secondary | ICD-10-CM | POA: Insufficient documentation

## 2014-02-08 DIAGNOSIS — K029 Dental caries, unspecified: Secondary | ICD-10-CM | POA: Insufficient documentation

## 2014-02-08 DIAGNOSIS — Z87891 Personal history of nicotine dependence: Secondary | ICD-10-CM | POA: Insufficient documentation

## 2014-02-08 DIAGNOSIS — G40909 Epilepsy, unspecified, not intractable, without status epilepticus: Secondary | ICD-10-CM | POA: Insufficient documentation

## 2014-02-08 MED ORDER — AMOXICILLIN 500 MG PO CAPS: 500.0000 mg | ORAL_CAPSULE | Freq: Three times a day (TID) | ORAL | Status: DC

## 2014-02-08 MED ORDER — AMOXICILLIN 250 MG PO CAPS
500.0000 mg | ORAL_CAPSULE | Freq: Once | ORAL | Status: AC
  Administered 2014-02-08: 500 mg via ORAL
  Filled 2014-02-08: qty 2

## 2014-02-08 MED ORDER — ACETAMINOPHEN-CODEINE #3 300-30 MG PO TABS: 1.0000 | ORAL_TABLET | Freq: Four times a day (QID) | ORAL | Status: DC | PRN

## 2014-02-08 NOTE — ED Notes (Signed)
Patient c/o dental pain on upper left side x2 weeks. Per patient has appointment in 3 weeks with dentist but pain has gotten unbearable. Patient reports taking ibuprofen with no relief.

## 2014-02-08 NOTE — ED Notes (Signed)
Pt c/o toothache x 2 weeks.  Reports has 2 broken teeth in left upper jaw.

## 2014-02-08 NOTE — ED Provider Notes (Signed)
CSN: [id number]     Arrival date & time 02/08/14  1427 History   First MD Initiated Contact with Patient 02/08/14 1501     Chief Complaint  Patient presents with  . Dental Pain     (Consider location/radiation/quality/duration/timing/severity/associated sxs/prior Treatment) HPI Comments: Patient is a 24 year old female who presents to the emergency department with a complaint of toothache. The patient states this problem started approximately 2 weeks prior to admission to the emergency department. She has had problems with her teeth for quite some time, but recently the pain is becoming significantly worse. Patient states she had some penicillin left over from a previous bout with her teeth, she took those but with only minimal improvement. She has not had any high fever. She's not had any difficulty with swallowing or breathing. She presents now for assistance with this problem.  Patient is a 24 y.o. female presenting with tooth pain. The history is provided by the patient.  Dental Pain Location:  Upper Associated symptoms: headaches   Associated symptoms: no neck pain     Past Medical History  Diagnosis Date  . Migraines   . Seizures   . PID (acute pelvic inflammatory disease)   . Ovarian cyst   . Splenic mass   . MRSA (methicillin resistant staph aureus) culture positive   . Kidney stones    History reviewed. No pertinent past surgical history. Family History  Problem Relation Age of Onset  . Diabetes Father    History  Substance Use Topics  . Smoking status: Former Smoker -- 0.50 packs/day for 5 years    Types: Cigarettes    Quit date: 08/12/2011  . Smokeless tobacco: Never Used  . Alcohol Use: No   OB History    Gravida Para Term Preterm AB TAB SAB Ectopic Multiple Living            0     Review of Systems  Constitutional: Negative for activity change.       All ROS Neg except as noted in HPI  HENT: Positive for dental problem. Negative for nosebleeds.   Eyes:  Negative for photophobia and discharge.  Respiratory: Negative for cough, shortness of breath and wheezing.   Cardiovascular: Negative for chest pain and palpitations.  Gastrointestinal: Negative for abdominal pain and blood in stool.  Genitourinary: Negative for dysuria, frequency and hematuria.  Musculoskeletal: Negative for back pain, arthralgias and neck pain.  Skin: Negative.   Neurological: Positive for seizures and headaches. Negative for dizziness and speech difficulty.  Psychiatric/Behavioral: Negative for hallucinations and confusion.      Allergies  Monosodium glutamate; Red dye; Toradol; and Tramadol  Home Medications   Prior to Admission medications   Medication Sig Start Date End Date Taking? Authorizing Provider  Aspirin-Salicylamide-Caffeine (BC HEADACHE) 325-95-16 MG TABS Take 1 packet by mouth every 4 (four) hours as needed.    Historical Provider, MD  busPIRone (BUSPAR) 10 MG tablet Take 10 mg by mouth 3 (three) times daily. 01/22/14   Historical Provider, MD  citalopram (CELEXA) 20 MG tablet Take 20 mg by mouth daily. 01/24/14   Historical Provider, MD   BP 119/73 mmHg  Pulse 87  Temp(Src) 98.7 F (37.1 C) (Oral)  Resp 16  Ht  (1.6 m)  Wt 137 lb (62.143 kg)  BMI 24.27 kg/m2  SpO2 100%  LMP 02/08/2014 Physical Exam  Constitutional: She is oriented to person, place, and time. She appears well-developed and well-nourished.  Non-toxic appearance.  HENT:  Head:  Normocephalic.  Right Ear: Tympanic membrane and external ear normal.  Left Ear: Tympanic membrane and external ear normal.  Patient has a deep dental caries of the left upper premolar and the left upper second molar. There is some swelling of the gum, and mild redness, but no visible abscess appreciated. The airway is patent. The speech is clear and understandable. No swelling under the tongue.  Eyes: EOM and lids are normal. Pupils are equal, round, and reactive to light.  Neck: Normal range of  motion. Neck supple. Carotid bruit is not present.  Cardiovascular: Normal rate, regular rhythm, normal heart sounds, intact distal pulses and normal pulses.   Pulmonary/Chest: Breath sounds normal. No respiratory distress.  Abdominal: Soft. Bowel sounds are normal. There is no tenderness. There is no guarding.  Musculoskeletal: Normal range of motion.  Lymphadenopathy:       Head (right side): No submandibular adenopathy present.       Head (left side): No submandibular adenopathy present.    She has no cervical adenopathy.  Neurological: She is alert and oriented to person, place, and time. She has normal strength. No cranial nerve deficit or sensory deficit.  Skin: Skin is warm and dry.  Psychiatric: She has a normal mood and affect. Her speech is normal.  Nursing note and vitals reviewed.   ED Course  Procedures (including critical care time) Labs Review Labs Reviewed - No data to display  Imaging Review No results found.   EKG Interpretation None      MDM  Patient states she has a dental appointment about 3 weeks. She states that she feels the pain is unbearable and she came to the emergency department for assistance with this. The examination is negative for Ludwig's angina. The vital signs are within normal limits. The oxygen level is 100% on room air, within normal limits by my interpretation.  Prescription for Amoxil and Tylenol codeine given to the patient, and she states the ibuprofen is beginning to hurt her stomach. Patient has an appointment with a dentist in about 3 weeks.    Final diagnoses:  Dental caries    *I have reviewed nursing notes, vital signs, and all appropriate lab and imaging results for this patient.    Kathie DikeHobson M Surena Welge, PA-C 02/08/14 1549  Donnetta HutchingBrian Cook, MD 02/09/14 [phone]

## 2014-02-08 NOTE — Discharge Instructions (Signed)
Dental Caries °Dental caries is tooth decay. This decay can cause a hole in teeth (cavity) that can get bigger and deeper over time. °HOME CARE °· Brush and floss your teeth. Do this at least two times a day. °· Use a fluoride toothpaste. °· Use a mouth rinse if told by your dentist or doctor. °· Eat less sugary and starchy foods. Drink less sugary drinks. °· Avoid snacking often on sugary and starchy foods. Avoid sipping often on sugary drinks. °· Keep regular checkups and cleanings with your dentist. °· Use fluoride supplements if told by your dentist or doctor. °· Allow fluoride to be applied to teeth if told by your dentist or doctor. °Document Released: 10/31/2007 Document Revised: 06/07/2013 Document Reviewed: 01/24/2012 °[employer]® Patient Information ©2015 [employer]. This information is not intended to replace advice given to you by your health care provider. Make sure you discuss any questions you have with your health care provider. ° °

## 2014-02-08 NOTE — Care Management Note (Signed)
ED/CM noted patient did not have health insurance and/or PCP listed in the computer.  Patient was given the [facility] resource handout with information on the clinics, food pantries, and the handout for new health insurance sign-up. Pt was also given a Rx. Discount card. Patient expressed appreciation for information received.

## 2014-04-11 ENCOUNTER — Emergency Department (HOSPITAL_COMMUNITY): Payer: Self-pay

## 2014-04-11 ENCOUNTER — Encounter (HOSPITAL_COMMUNITY): Payer: Self-pay | Admitting: Emergency Medicine

## 2014-04-11 ENCOUNTER — Emergency Department (HOSPITAL_COMMUNITY)
Admission: EM | Admit: 2014-04-11 | Discharge: 2014-04-11 | Disposition: A | Discharge status: Home / Self Care | Payer: Self-pay | Attending: Emergency Medicine | Admitting: Emergency Medicine

## 2014-04-11 DIAGNOSIS — Z8742 Personal history of other diseases of the female genital tract: Secondary | ICD-10-CM | POA: Insufficient documentation

## 2014-04-11 DIAGNOSIS — R062 Wheezing: Secondary | ICD-10-CM | POA: Insufficient documentation

## 2014-04-11 DIAGNOSIS — Z87442 Personal history of urinary calculi: Secondary | ICD-10-CM | POA: Insufficient documentation

## 2014-04-11 DIAGNOSIS — G43909 Migraine, unspecified, not intractable, without status migrainosus: Secondary | ICD-10-CM | POA: Insufficient documentation

## 2014-04-11 DIAGNOSIS — G40909 Epilepsy, unspecified, not intractable, without status epilepticus: Secondary | ICD-10-CM | POA: Insufficient documentation

## 2014-04-11 DIAGNOSIS — M791 Myalgia: Secondary | ICD-10-CM | POA: Insufficient documentation

## 2014-04-11 DIAGNOSIS — Z8614 Personal history of Methicillin resistant Staphylococcus aureus infection: Secondary | ICD-10-CM | POA: Insufficient documentation

## 2014-04-11 DIAGNOSIS — H9209 Otalgia, unspecified ear: Secondary | ICD-10-CM | POA: Insufficient documentation

## 2014-04-11 DIAGNOSIS — R509 Fever, unspecified: Secondary | ICD-10-CM | POA: Insufficient documentation

## 2014-04-11 DIAGNOSIS — J029 Acute pharyngitis, unspecified: Secondary | ICD-10-CM | POA: Insufficient documentation

## 2014-04-11 DIAGNOSIS — Z79899 Other long term (current) drug therapy: Secondary | ICD-10-CM | POA: Insufficient documentation

## 2014-04-11 DIAGNOSIS — R0981 Nasal congestion: Secondary | ICD-10-CM | POA: Insufficient documentation

## 2014-04-11 DIAGNOSIS — Z7982 Long term (current) use of aspirin: Secondary | ICD-10-CM | POA: Insufficient documentation

## 2014-04-11 DIAGNOSIS — Z792 Long term (current) use of antibiotics: Secondary | ICD-10-CM | POA: Insufficient documentation

## 2014-04-11 DIAGNOSIS — M1990 Unspecified osteoarthritis, unspecified site: Secondary | ICD-10-CM | POA: Insufficient documentation

## 2014-04-11 DIAGNOSIS — R6889 Other general symptoms and signs: Secondary | ICD-10-CM

## 2014-04-11 MED ORDER — PROMETHAZINE-CODEINE 6.25-10 MG/5ML PO SYRP
5.0000 mL | ORAL_SOLUTION | Freq: Once | ORAL | Status: AC
Start: 2014-04-11 — End: 2014-04-11
  Administered 2014-04-11: 5 mL via ORAL
  Filled 2014-04-11: qty 5

## 2014-04-11 MED ORDER — PROMETHAZINE-CODEINE 6.25-10 MG/5ML PO SYRP: 5.0000 mL | ORAL_SOLUTION | ORAL | Status: DC | PRN

## 2014-04-11 MED ORDER — ALBUTEROL SULFATE HFA 108 (90 BASE) MCG/ACT IN AERS
1.0000 | INHALATION_SPRAY | Freq: Once | RESPIRATORY_TRACT | Status: AC
  Administered 2014-04-11: 2 via RESPIRATORY_TRACT
  Filled 2014-04-11: qty 6.7

## 2014-04-11 NOTE — Discharge Instructions (Signed)
Viral Infections A viral infection can be caused by different types of viruses.Most viral infections are not serious and resolve on their own. However, some infections may cause severe symptoms and may lead to further complications. SYMPTOMS Viruses can frequently cause:  Minor sore throat.  Aches and pains.  Headaches.  Runny nose.  Different types of rashes.  Watery eyes.  Tiredness.  Cough.  Loss of appetite.  Gastrointestinal infections, resulting in nausea, vomiting, and diarrhea. These symptoms do not respond to antibiotics because the infection is not caused by bacteria. However, you might catch a bacterial infection following the viral infection. This is sometimes called a "superinfection." Symptoms of such a bacterial infection may include:  Worsening sore throat with pus and difficulty swallowing.  Swollen neck glands.  Chills and a high or persistent fever.  Severe headache.  Tenderness over the sinuses.  Persistent overall ill feeling (malaise), muscle aches, and tiredness (fatigue).  Persistent cough.  Yellow, green, or brown mucus production with coughing. HOME CARE INSTRUCTIONS   Only take over-the-counter or prescription medicines for pain, discomfort, diarrhea, or fever as directed by your caregiver.  Drink enough water and fluids to keep your urine clear or pale yellow. Sports drinks can provide valuable electrolytes, sugars, and hydration.  Get plenty of rest and maintain proper nutrition. Soups and broths with crackers or rice are fine. SEEK IMMEDIATE MEDICAL CARE IF:   You have severe headaches, shortness of breath, chest pain, neck pain, or an unusual rash.  You have uncontrolled vomiting, diarrhea, or you are unable to keep down fluids.  You or your child has an oral temperature above 102 F (38.9 C), not controlled by medicine.  Your baby is older than 3 months with a rectal temperature of 102 F (38.9 C) or higher.  Your baby is 293  months old or younger with a rectal temperature of 100.4 F (38 C) or higher. MAKE SURE YOU:   Understand these instructions.  Will watch your condition.  Will get help right away if you are not doing well or get worse. Document Released: 10/31/2004 Document Revised: 04/15/2011 Document Reviewed: 05/28/2010 [facility] Patient Information 2015 [place]. This information is not intended to replace advice given to you by your health care provider. Make sure you discuss any questions you have with your health care provider.  Make sure you are drinking plenty of fluids.  Rest.  Take tylenol or motrin for treatment of your body aches and fever.  You may take the cough syrup prescribed for cough.  This will make you drowsy - do not drive within 4 hours of taking this medication. Use the inhaler medicine 2 puffs every 4 hours if you are coughing, wheezing or short of breath.  Your xray is negative today for pneumonia.

## 2014-04-11 NOTE — ED Notes (Signed)
Pt reports sore throat and cough x2 days. Pt denies any known fevers. nad noted.

## 2014-04-11 NOTE — ED Provider Notes (Signed)
CSN: [id number]     Arrival date & time 04/11/14  1049 History  This chart was scribed for non-physician practitioner, Burgess AmorJulie Tarryn Bogdan, PA-C working with Benjiman CoreNathan Pickering, MD, by Jarvis Morganaylor Ferguson, ED Scribe. This patient was seen in room APFT21/APFT21 and the patient's care was started at 2:55 PM.    Chief Complaint  Patient presents with  . Generalized Body Aches    The history is provided by the patient. No language interpreter was used.    HPI Comments: [name] is a 24 y.o. female who presents to the Emergency Department complaining of constant, gradually worsening, sore throat for 3 days. She is complaining of associated bilateral otalgia, headache, generalized body aches, mild wheezing, low grade fever (t-max 100.8 F), cough productive of green and brown sputum and clear congestion. She did not get a flu vaccination this year. Pt states the sore throat is exacerbated by coughing and eating. She has been taking Excedrin with no relief. Pt is a current every day smoker and smokes 0.5 ppd. She denies any SOB, hemoptysis nausea, vomiting, abdominal pain or diarrhea.    Past Medical History  Diagnosis Date  . Migraines   . Seizures   . PID (acute pelvic inflammatory disease)   . Ovarian cyst   . Splenic mass   . MRSA (methicillin resistant staph aureus) culture positive   . Kidney stones    History reviewed. No pertinent past surgical history. Family History  Problem Relation Age of Onset  . Diabetes Father    History  Substance Use Topics  . Smoking status: Former Smoker -- 0.50 packs/day for 5 years    Types: Cigarettes    Quit date: 08/12/2011  . Smokeless tobacco: Never Used  . Alcohol Use: No   OB History    Gravida Para Term Preterm AB TAB SAB Ectopic Multiple Living            0     Review of Systems  Constitutional: Positive for fever (t-max 100.8 F) and chills.  HENT: Positive for congestion, ear pain, rhinorrhea and sore throat. Negative for sinus pressure,  trouble swallowing and voice change.   Eyes: Negative for discharge.  Respiratory: Positive for cough and wheezing. Negative for shortness of breath and stridor.   Cardiovascular: Negative for chest pain.  Gastrointestinal: Negative for nausea, vomiting, abdominal pain and diarrhea.  Genitourinary: Negative.   Musculoskeletal: Positive for myalgias and arthralgias.  Neurological: Positive for headaches.      Allergies  Monosodium glutamate; Red dye; Toradol; and Tramadol  Home Medications   Prior to Admission medications   Medication Sig Start Date End Date Taking? Authorizing Provider  acetaminophen-codeine (TYLENOL #3) 300-30 MG per tablet Take 1-2 tablets by mouth every 6 (six) hours as needed. 02/08/14   Ivery QualeHobson Bryant, PA-C  amoxicillin (AMOXIL) 500 MG capsule Take 1 capsule (500 mg total) by mouth 3 (three) times daily. 02/08/14   Ivery QualeHobson Bryant, PA-C  Aspirin-Salicylamide-Caffeine (BC HEADACHE) 325-95-16 MG TABS Take 1 packet by mouth every 4 (four) hours as needed.    Historical Provider, MD  busPIRone (BUSPAR) 10 MG tablet Take 10 mg by mouth 3 (three) times daily. 01/22/14   Historical Provider, MD  citalopram (CELEXA) 20 MG tablet Take 20 mg by mouth daily. 01/24/14   Historical Provider, MD  promethazine-codeine (PHENERGAN WITH CODEINE) 6.25-10 MG/5ML syrup Take 5 mLs by mouth every 4 (four) hours as needed for cough. 04/11/14   Burgess AmorJulie Yissel Habermehl, PA-C   Triage Vitals: BP 97/55  mmHg  Pulse 93  Temp(Src) 100.3 F (37.9 C) (Oral)  Resp 18  Ht  (1.6 m)  Wt 135 lb (61.236 kg)  BMI 23.92 kg/m2  SpO2 99%  LMP 03/20/2014  Physical Exam  Constitutional: She is oriented to person, place, and time. She appears well-developed and well-nourished.  HENT:  Head: Normocephalic and atraumatic.  Right Ear: Tympanic membrane and ear canal normal.  Left Ear: Tympanic membrane and ear canal normal.  Nose: Mucosal edema and rhinorrhea present.  Mouth/Throat: Uvula is midline, oropharynx is  clear and moist and mucous membranes are normal. No oropharyngeal exudate, posterior oropharyngeal edema, posterior oropharyngeal erythema or tonsillar abscesses.  Eyes: Conjunctivae are normal.  Cardiovascular: Normal rate and normal heart sounds.   Pulmonary/Chest: Effort normal and breath sounds normal. No respiratory distress. She has no wheezes. She has no rhonchi. She has no rales.  Coarse breath sounds throughout all lung fields  Abdominal: Soft. There is no tenderness.  Musculoskeletal: Normal range of motion.  Neurological: She is alert and oriented to person, place, and time.  Skin: Skin is warm and dry. No rash noted.  Psychiatric: She has a normal mood and affect.    ED Course  Procedures (including critical care time)  DIAGNOSTIC STUDIES: Oxygen Saturation is 99% on RA, normal by my interpretation.    COORDINATION OF CARE: 2:59 PM- Will order CXR. Pt advised of plan for treatment and pt agrees.  3:45 PM- Will d/c pt home with Phenergan with Codeine syrup.  Pt advised of plan for treatment and pt agrees.   Labs Review Labs Reviewed - No data to display  Imaging Review Dg Chest 2 View  04/11/2014   CLINICAL DATA:  Cough, congestion and fever since Saturday.  EXAM: CHEST  2 VIEW  COMPARISON:  August 03, 2013  FINDINGS: The heart size and mediastinal contours are within normal limits. There is no focal infiltrate, pulmonary edema, or pleural effusion. The visualized skeletal structures are unremarkable.  IMPRESSION: No active cardiopulmonary disease.   Electronically Signed   By: Sherian Rein M.D.   On: 04/11/2014 15:32     EKG Interpretation None      MDM   Final diagnoses:  Flu-like symptoms    Patients labs and/or radiological studies were reviewed and considered during the medical decision making and disposition process.  Results were also discussed with patient. Pt with flu like sx with sudden onset.  Wheezing per hx, not on exam today. She was encouraged rest,  increased fluids, albuterol mdi given, phenergan /codeine prescribed. Ibuprofen/tylenol for fever tx.  Recheck if sx worsen or persist.  I personally performed the services described in this documentation, which was scribed in my presence. The recorded information has been reviewed and is accurate.   Burgess Amor, PA-C 04/13/14 1442  Benjiman Core, MD 04/13/14 [phone]

## 2014-04-11 NOTE — ED Notes (Signed)
Pt given mask in triage

## 2014-06-01 ENCOUNTER — Other Ambulatory Visit (HOSPITAL_COMMUNITY)
Admission: RE | Admit: 2014-06-01 | Discharge: 2014-06-01 | Disposition: A | Discharge status: Home / Self Care | Payer: Self-pay | Source: Ambulatory Visit | Attending: *Deleted | Admitting: *Deleted

## 2014-06-12 ENCOUNTER — Emergency Department (HOSPITAL_COMMUNITY)
Admission: EM | Admit: 2014-06-12 | Discharge: 2014-06-12 | Disposition: A | Discharge status: Home / Self Care | Payer: Self-pay | Attending: Emergency Medicine | Admitting: Emergency Medicine

## 2014-06-12 ENCOUNTER — Encounter (HOSPITAL_COMMUNITY): Payer: Self-pay | Admitting: Emergency Medicine

## 2014-06-12 DIAGNOSIS — Y929 Unspecified place or not applicable: Secondary | ICD-10-CM | POA: Insufficient documentation

## 2014-06-12 DIAGNOSIS — Y999 Unspecified external cause status: Secondary | ICD-10-CM | POA: Insufficient documentation

## 2014-06-12 DIAGNOSIS — Z8742 Personal history of other diseases of the female genital tract: Secondary | ICD-10-CM | POA: Insufficient documentation

## 2014-06-12 DIAGNOSIS — G40909 Epilepsy, unspecified, not intractable, without status epilepticus: Secondary | ICD-10-CM | POA: Insufficient documentation

## 2014-06-12 DIAGNOSIS — S0031XA Abrasion of nose, initial encounter: Secondary | ICD-10-CM | POA: Insufficient documentation

## 2014-06-12 DIAGNOSIS — Z8614 Personal history of Methicillin resistant Staphylococcus aureus infection: Secondary | ICD-10-CM | POA: Insufficient documentation

## 2014-06-12 DIAGNOSIS — X58XXXA Exposure to other specified factors, initial encounter: Secondary | ICD-10-CM | POA: Insufficient documentation

## 2014-06-12 DIAGNOSIS — Z87891 Personal history of nicotine dependence: Secondary | ICD-10-CM | POA: Insufficient documentation

## 2014-06-12 DIAGNOSIS — Z87442 Personal history of urinary calculi: Secondary | ICD-10-CM | POA: Insufficient documentation

## 2014-06-12 DIAGNOSIS — L02511 Cutaneous abscess of right hand: Secondary | ICD-10-CM | POA: Insufficient documentation

## 2014-06-12 DIAGNOSIS — L0291 Cutaneous abscess, unspecified: Secondary | ICD-10-CM

## 2014-06-12 DIAGNOSIS — G43909 Migraine, unspecified, not intractable, without status migrainosus: Secondary | ICD-10-CM | POA: Insufficient documentation

## 2014-06-12 DIAGNOSIS — Y939 Activity, unspecified: Secondary | ICD-10-CM | POA: Insufficient documentation

## 2014-06-12 DIAGNOSIS — Z79899 Other long term (current) drug therapy: Secondary | ICD-10-CM | POA: Insufficient documentation

## 2014-06-12 MED ORDER — MUPIROCIN 2 % EX OINT: 1.0000 "application " | TOPICAL_OINTMENT | Freq: Two times a day (BID) | CUTANEOUS | Status: DC

## 2014-06-12 MED ORDER — SULFAMETHOXAZOLE-TRIMETHOPRIM 800-160 MG PO TABS: 1.0000 | ORAL_TABLET | Freq: Two times a day (BID) | ORAL | Status: AC

## 2014-06-12 MED ORDER — HYDROCODONE-ACETAMINOPHEN 5-325 MG PO TABS: 1.0000 | ORAL_TABLET | ORAL | Status: DC | PRN

## 2014-06-12 NOTE — ED Notes (Signed)
Patient c/o abscess to right anterior forearm x2 days. Denies any fevers or drainage. Patient reports hx of MRSA. Patient also reports "Scab in right nare."

## 2014-06-12 NOTE — ED Provider Notes (Signed)
CSN: [id number]     Arrival date & time 06/12/14  1259 History  This chart was scribed for a non-physician practitioner, Burgess AmorJulie Kahlyn Shippey, PA-C working with Rolland PorterMark James, MD by [place] Peace, ED Scribe. The patient was seen in APFT21/APFT21. The patient's care was started at 1:51 PM.    Chief Complaint  Patient presents with  . Abscess      Patient is a 24 y.o. female presenting with abscess. The history is provided by the patient. No language interpreter was used.  Abscess Location:  Shoulder/arm Shoulder/arm abscess location:  R forearm Abscess quality: painful, redness and warmth   Abscess quality: not draining and no fluctuance   Red streaking: no   Progression:  Worsening Pain details:    Quality:  Throbbing   Severity:  Severe Context: not insect bite/sting   Ineffective treatments:  None tried Associated symptoms: no fever, no headaches and no nausea   Risk factors: hx of MRSA   HPI Comments: [name] is a 24 y.o. female who presents to the Emergency Department complaining of abscess to right anterior forearm x 2 days that pt states is very painful. She describes affected area as "hot" and "throbbing". Pt also notes small red area to nose that has been causing her irritation as well. She denies any fevers or drainage from affected area. Pt further denies any recent IV drug use (last use: 1 year ago). Allergies to Toradol, Tramadol, Red Dye, and Monosodium Glutamate. History of MRSA.   Past Medical History  Diagnosis Date  . Migraines   . Seizures   . PID (acute pelvic inflammatory disease)   . Ovarian cyst   . Splenic mass   . MRSA (methicillin resistant staph aureus) culture positive   . Kidney stones    History reviewed. No pertinent past surgical history. Family History  Problem Relation Age of Onset  . Diabetes Father    History  Substance Use Topics  . Smoking status: Former Smoker -- 0.50 packs/day for 5 years    Types: Cigarettes    Quit date: 08/12/2011  .  Smokeless tobacco: Never Used  . Alcohol Use: No   OB History    Gravida Para Term Preterm AB TAB SAB Ectopic Multiple Living            0     Review of Systems  Constitutional: Negative for fever.  HENT: Negative for congestion and sore throat.   Eyes: Negative.   Respiratory: Negative for chest tightness and shortness of breath.   Cardiovascular: Negative for chest pain.  Gastrointestinal: Negative for nausea and abdominal pain.  Genitourinary: Negative.   Musculoskeletal: Negative for joint swelling, arthralgias and neck pain.  Skin: Positive for color change and wound.  Neurological: Negative for dizziness, weakness, light-headedness, numbness and headaches.  Psychiatric/Behavioral: Negative.       Allergies  Monosodium glutamate; Red dye; Toradol; and Tramadol  Home Medications   Prior to Admission medications   Medication Sig Start Date End Date Taking? Authorizing Provider  Aspirin-Salicylamide-Caffeine (BC HEADACHE) 325-95-16 MG TABS Take 1 packet by mouth every 4 (four) hours as needed.   Yes Historical Provider, MD  citalopram (CELEXA) 20 MG tablet Take 20 mg by mouth daily. 01/24/14  Yes Historical Provider, MD  cloNIDine (CATAPRES) 0.1 MG tablet Take 0.1 mg by mouth at bedtime.   Yes Historical Provider, MD  acetaminophen-codeine (TYLENOL #3) 300-30 MG per tablet Take 1-2 tablets by mouth every 6 (six) hours as needed. Patient not  taking: Reported on 06/12/2014 02/08/14   Ivery QualeHobson Bryant, PA-C  amoxicillin (AMOXIL) 500 MG capsule Take 1 capsule (500 mg total) by mouth 3 (three) times daily. Patient not taking: Reported on 06/12/2014 02/08/14   Ivery QualeHobson Bryant, PA-C  HYDROcodone-acetaminophen (NORCO/VICODIN) 5-325 MG per tablet Take 1 tablet by mouth every 4 (four) hours as needed. 06/12/14   Burgess AmorJulie Koltan Portocarrero, PA-C  mupirocin ointment (BACTROBAN) 2 % Place 1 application into the nose 2 (two) times daily. 06/12/14   Burgess AmorJulie Kawon Willcutt, PA-C  promethazine-codeine (PHENERGAN WITH CODEINE) 6.25-10  MG/5ML syrup Take 5 mLs by mouth every 4 (four) hours as needed for cough. Patient not taking: Reported on 06/12/2014 04/11/14   Burgess AmorJulie Tadhg Eskew, PA-C  sulfamethoxazole-trimethoprim (BACTRIM DS,SEPTRA DS) 800-160 MG per tablet Take 1 tablet by mouth 2 (two) times daily. 06/12/14 06/19/14  Burgess AmorJulie Gennifer Potenza, PA-C   BP 112/74 mmHg  Pulse 88  Temp(Src) 98.6 F (37 C) (Oral)  Resp 20  Ht 5\' 3"  (1.6 m)  Wt 138 lb (62.596 kg)  BMI 24.45 kg/m2  SpO2 100%  LMP 05/22/2014 Physical Exam  Constitutional: She appears well-developed and well-nourished.  HENT:  Head: Normocephalic and atraumatic.  Small abrasion at right nostril edge that does not appear infected.   Eyes: Conjunctivae are normal.  Neck: Normal range of motion.  Cardiovascular: Normal rate, regular rhythm, normal heart sounds and intact distal pulses.   Pulmonary/Chest: Effort normal and breath sounds normal. She has no wheezes.  Abdominal: Soft. Bowel sounds are normal. There is no tenderness.  Musculoskeletal: Normal range of motion.  Neurological: She is alert.  Skin: Skin is warm and dry.  1cm raised, tender papule to right mid-volar forearm. No red streaking. No fluctuance, drainage, or epitrochlear or axillary adenopathy.   Psychiatric: She has a normal mood and affect.  Nursing note and vitals reviewed.   ED Course  Procedures (including critical care time) Labs Review Labs Reviewed - No data to display  Imaging Review No results found.   EKG Interpretation None     Medications - No data to display  1:53 PM- Treatment plan was discussed with patient who verbalizes understanding and agrees.   MDM   Final diagnoses:  Abscess    Discussed I and D vs abx and warm soaks, pt opts to try abx at this time which is reasonable as abscess is small and may resolve with abx.  Warm soaks, prescribed bactrim, also prescribed mupirocin ointment for nasal tx.  Advised prn f/u with pcp for recheck if sx worsen or fail to resolve with this  tx.  The patient appears reasonably screened and/or stabilized for discharge and I doubt any other medical condition or other [facility] requiring further screening, evaluation, or treatment in the ED at this time prior to discharge.  I personally performed the services described in this documentation, which was scribed in my presence. The recorded information has been reviewed and is accurate.     Burgess AmorJulie Della Homan, PA-C 06/14/14 0533  Rolland PorterMark James, MD 06/20/14 [phone]

## 2014-06-12 NOTE — Discharge Instructions (Signed)

## 2014-07-14 ENCOUNTER — Emergency Department (HOSPITAL_COMMUNITY)
Admission: EM | Admit: 2014-07-14 | Discharge: 2014-07-14 | Disposition: A | Discharge status: Home / Self Care | Payer: Self-pay | Attending: Emergency Medicine | Admitting: Emergency Medicine

## 2014-07-14 ENCOUNTER — Encounter (HOSPITAL_COMMUNITY): Payer: Self-pay | Admitting: Emergency Medicine

## 2014-07-14 DIAGNOSIS — G43909 Migraine, unspecified, not intractable, without status migrainosus: Secondary | ICD-10-CM | POA: Insufficient documentation

## 2014-07-14 DIAGNOSIS — Z87442 Personal history of urinary calculi: Secondary | ICD-10-CM | POA: Insufficient documentation

## 2014-07-14 DIAGNOSIS — Z880 Allergy status to penicillin: Secondary | ICD-10-CM | POA: Insufficient documentation

## 2014-07-14 DIAGNOSIS — K047 Periapical abscess without sinus: Secondary | ICD-10-CM | POA: Insufficient documentation

## 2014-07-14 DIAGNOSIS — Z87891 Personal history of nicotine dependence: Secondary | ICD-10-CM | POA: Insufficient documentation

## 2014-07-14 DIAGNOSIS — Z79899 Other long term (current) drug therapy: Secondary | ICD-10-CM | POA: Insufficient documentation

## 2014-07-14 DIAGNOSIS — Z8742 Personal history of other diseases of the female genital tract: Secondary | ICD-10-CM | POA: Insufficient documentation

## 2014-07-14 DIAGNOSIS — Z8614 Personal history of Methicillin resistant Staphylococcus aureus infection: Secondary | ICD-10-CM | POA: Insufficient documentation

## 2014-07-14 MED ORDER — AMOXICILLIN 500 MG PO CAPS: 500.0000 mg | ORAL_CAPSULE | Freq: Three times a day (TID) | ORAL | Status: AC

## 2014-07-14 MED ORDER — HYDROCODONE-ACETAMINOPHEN 5-325 MG PO TABS: 1.0000 | ORAL_TABLET | ORAL | Status: DC | PRN

## 2014-07-14 NOTE — ED Notes (Signed)
Pt reports onset of L upper jaw dental pain 2 days ago.

## 2014-07-14 NOTE — ED Notes (Signed)
PA Julie Idol at bedside. 

## 2014-07-14 NOTE — Discharge Instructions (Signed)

## 2014-07-16 NOTE — ED Provider Notes (Signed)
CSN: [id number]     Arrival date & time 07/14/14  1254 History   First MD Initiated Contact with Patient 07/14/14 1317     Chief Complaint  Patient presents with  . Dental Pain     (Consider location/radiation/quality/duration/timing/severity/associated sxs/prior Treatment) The history is provided by the patient.   [name] is a 24 y.o. female presenting with a 2 day history of dental pain and gingival swelling.   The patient has a history of decay in the tooth involved which has recently started to cause increased  pain.  There has been no fevers, chills, nausea or vomiting, also no complaint of difficulty swallowing, although chewing makes pain worse.  The patient has tried nsaids  without relief of symptoms.         Past Medical History  Diagnosis Date  . Migraines   . Seizures   . PID (acute pelvic inflammatory disease)   . Ovarian cyst   . Splenic mass   . MRSA (methicillin resistant staph aureus) culture positive   . Kidney stones    History reviewed. No pertinent past surgical history. Family History  Problem Relation Age of Onset  . Diabetes Father    History  Substance Use Topics  . Smoking status: Former Smoker -- 0.50 packs/day for 5 years    Types: Cigarettes    Quit date: 08/12/2011  . Smokeless tobacco: Never Used  . Alcohol Use: No   OB History    Gravida Para Term Preterm AB TAB SAB Ectopic Multiple Living            0     Review of Systems  Constitutional: Negative for fever.  HENT: Positive for dental problem. Negative for facial swelling and sore throat.   Respiratory: Negative for shortness of breath.   Musculoskeletal: Negative for neck pain and neck stiffness.      Allergies  Amoxicillin; Monosodium glutamate; Red dye; Toradol; and Tramadol  Home Medications   Prior to Admission medications   Medication Sig Start Date End Date Taking? Authorizing Provider  acetaminophen-codeine (TYLENOL #3) 300-30 MG per tablet Take 1-2  tablets by mouth every 6 (six) hours as needed. Patient not taking: Reported on 06/12/2014 02/08/14   Ivery Quale, PA-C  amoxicillin (AMOXIL) 500 MG capsule Take 1 capsule (500 mg total) by mouth 3 (three) times daily. 07/14/14 07/24/14  Burgess Amor, PA-C  Aspirin-Salicylamide-Caffeine (BC HEADACHE) 325-95-16 MG TABS Take 1 packet by mouth every 4 (four) hours as needed.    Historical Provider, MD  citalopram (CELEXA) 20 MG tablet Take 20 mg by mouth daily. 01/24/14   Historical Provider, MD  cloNIDine (CATAPRES) 0.1 MG tablet Take 0.1 mg by mouth at bedtime.    Historical Provider, MD  HYDROcodone-acetaminophen (NORCO/VICODIN) 5-325 MG per tablet Take 1 tablet by mouth every 4 (four) hours as needed. 07/14/14   Burgess Amor, PA-C  mupirocin ointment (BACTROBAN) 2 % Place 1 application into the nose 2 (two) times daily. 06/12/14   Burgess Amor, PA-C  promethazine-codeine (PHENERGAN WITH CODEINE) 6.25-10 MG/5ML syrup Take 5 mLs by mouth every 4 (four) hours as needed for cough. Patient not taking: Reported on 06/12/2014 04/11/14   Burgess Amor, PA-C   BP 104/51 mmHg  Pulse 78  Temp(Src) 97.7 F (36.5 C) (Oral)  Resp 18  Ht  (1.6 m)  Wt 140 lb (63.504 kg)  BMI 24.81 kg/m2  SpO2 100%  LMP 06/20/2014 Physical Exam  Constitutional: She is oriented to person, place,  and time. She appears well-developed and well-nourished. No distress.  HENT:  Head: Normocephalic and atraumatic.  Right Ear: Tympanic membrane and external ear normal.  Left Ear: Tympanic membrane and external ear normal.  Mouth/Throat: Oropharynx is clear and moist and mucous membranes are normal. No oral lesions. No trismus in the jaw. Dental abscesses present.    Eyes: Conjunctivae are normal.  Neck: Normal range of motion. Neck supple.  Cardiovascular: Normal rate and normal heart sounds.   Pulmonary/Chest: Effort normal.  Abdominal: She exhibits no distension.  Musculoskeletal: Normal range of motion.  Lymphadenopathy:    She has no  cervical adenopathy.  Neurological: She is alert and oriented to person, place, and time.  Skin: Skin is warm and dry. No erythema.  Psychiatric: She has a normal mood and affect.    ED Course  Procedures (including critical care time) Labs Review Labs Reviewed - No data to display  Imaging Review No results found.   EKG Interpretation None      MDM   Final diagnoses:  Dental abscess    Pt with dental abscess.  She is planning to establish care with dentist this week. Discussed abx choices. She has diarrhea from amoxil, not allergic, worse diarrhea from clindamcyin. She prefers amoxil, will take with antidiarrheal if needed.  Suggested yogurt to diet. Few hydrocodone. Planned f/u with dentist as planned.    Burgess Amor, PA-C 07/16/14 7510  [name], MD 07/16/14 [phone]

## 2014-07-20 ENCOUNTER — Encounter (HOSPITAL_COMMUNITY): Payer: Self-pay | Admitting: Emergency Medicine

## 2014-07-20 ENCOUNTER — Emergency Department (HOSPITAL_COMMUNITY)
Admission: EM | Admit: 2014-07-20 | Discharge: 2014-07-20 | Disposition: A | Discharge status: Home / Self Care | Payer: Self-pay | Attending: Emergency Medicine | Admitting: Emergency Medicine

## 2014-07-20 DIAGNOSIS — Z8614 Personal history of Methicillin resistant Staphylococcus aureus infection: Secondary | ICD-10-CM | POA: Insufficient documentation

## 2014-07-20 DIAGNOSIS — Z87442 Personal history of urinary calculi: Secondary | ICD-10-CM | POA: Insufficient documentation

## 2014-07-20 DIAGNOSIS — Z8742 Personal history of other diseases of the female genital tract: Secondary | ICD-10-CM | POA: Insufficient documentation

## 2014-07-20 DIAGNOSIS — Z880 Allergy status to penicillin: Secondary | ICD-10-CM | POA: Insufficient documentation

## 2014-07-20 DIAGNOSIS — Z87891 Personal history of nicotine dependence: Secondary | ICD-10-CM | POA: Insufficient documentation

## 2014-07-20 DIAGNOSIS — Z79899 Other long term (current) drug therapy: Secondary | ICD-10-CM | POA: Insufficient documentation

## 2014-07-20 DIAGNOSIS — G43909 Migraine, unspecified, not intractable, without status migrainosus: Secondary | ICD-10-CM | POA: Insufficient documentation

## 2014-07-20 DIAGNOSIS — K029 Dental caries, unspecified: Secondary | ICD-10-CM | POA: Insufficient documentation

## 2014-07-20 MED ORDER — IBUPROFEN 800 MG PO TABS
800.0000 mg | ORAL_TABLET | Freq: Once | ORAL | Status: AC
Start: 2014-07-20 — End: 2014-07-20
  Administered 2014-07-20: 800 mg via ORAL
  Filled 2014-07-20: qty 1

## 2014-07-20 MED ORDER — CEFTRIAXONE SODIUM 1 G IJ SOLR
1.0000 g | Freq: Once | INTRAMUSCULAR | Status: DC
  Filled 2014-07-20: qty 10

## 2014-07-20 MED ORDER — ACETAMINOPHEN 325 MG PO TABS
650.0000 mg | ORAL_TABLET | Freq: Once | ORAL | Status: AC
  Administered 2014-07-20: 650 mg via ORAL
  Filled 2014-07-20: qty 2

## 2014-07-20 MED ORDER — LIDOCAINE HCL (PF) 1 % IJ SOLN
INTRAMUSCULAR | Status: AC
  Filled 2014-07-20: qty 5

## 2014-07-20 NOTE — ED Notes (Signed)
Pt states left upper tooth has broken off and the gum to the  area is receeding as well. Pt states she has been taking amoxicilin  with no relief. States she has been taking BCs and ibuprofen for pain and it is now hurting her stomach. Unable to get in to see dentist in [place] for another two weeks.

## 2014-07-20 NOTE — ED Notes (Signed)
Pt refused shot of Rocephin. Pt became upset when Tylenol and ibuprofen were offered stating, "What the fuck is that supposed to do?" Explained that ERs are no longer able to treat chronic pain with narcotics. Explained that she needed the rocephin and pt still refused. Pt did however take the ibuprofen and tylenol, refused to wait on d/c papers which were ready. Pt and friend walked out without signing d/c papers.

## 2014-07-20 NOTE — Discharge Instructions (Signed)
You were treated in the emergency department with intramuscular Rocephin. Your vital signs are well within normal limits. Please call the community wellness Center, or the [facility] for assistance with managing your discomfort until you're dental appointment. 600 mg of ibuprofen and 500 mg of Tylenol every 6 hours may be helpful for this situation. Dental Caries Dental caries is tooth decay. This decay can cause a hole in teeth (cavity) that can get bigger and deeper over time. HOME CARE  Brush and floss your teeth. Do this at least two times a day.  Use a fluoride toothpaste.  Use a mouth rinse if told by your dentist or doctor.  Eat less sugary and starchy foods. Drink less sugary drinks.  Avoid snacking often on sugary and starchy foods. Avoid sipping often on sugary drinks.  Keep regular checkups and cleanings with your dentist.  Use fluoride supplements if told by your dentist or doctor.  Allow fluoride to be applied to teeth if told by your dentist or doctor. Document Released: 10/31/2007 Document Revised: 06/07/2013 Document Reviewed: 01/24/2012 [facility] Patient Information 2015 [place], [place]. This information is not intended to replace advice given to you by your health care provider. Make sure you discuss any questions you have with your health care provider.

## 2014-07-20 NOTE — ED Provider Notes (Signed)
CSN: [id number]     Arrival date & time 07/20/14  1252 History   First MD Initiated Contact with Patient 07/20/14 1420     Chief Complaint  Patient presents with  . Dental Pain     (Consider location/radiation/quality/duration/timing/severity/associated sxs/prior Treatment) Patient is a 24 y.o. female presenting with tooth pain. The history is provided by the patient.  Dental Pain Location:  Upper Upper teeth location:  14/LU 1st molar Quality:  Aching and shooting Severity:  Moderate Onset quality:  Gradual Duration:  10 days Timing:  Intermittent Progression:  Worsening Chronicity:  Chronic Context: dental caries and poor dentition   Relieved by:  Nothing Worsened by:  Cold food/drink Ineffective treatments: goody powder. Associated symptoms: gum swelling and headaches   Associated symptoms: no drooling, no facial swelling, no fever and no trismus   Risk factors: lack of dental care   Risk factors: no diabetes, no immunosuppression and no smoking     Past Medical History  Diagnosis Date  . Migraines   . Seizures   . PID (acute pelvic inflammatory disease)   . Ovarian cyst   . Splenic mass   . MRSA (methicillin resistant staph aureus) culture positive   . Kidney stones    History reviewed. No pertinent past surgical history. Family History  Problem Relation Age of Onset  . Diabetes Father    History  Substance Use Topics  . Smoking status: Former Smoker -- 0.50 packs/day for 5 years    Types: Cigarettes    Quit date: 08/12/2011  . Smokeless tobacco: Never Used  . Alcohol Use: No   OB History    Gravida Para Term Preterm AB TAB SAB Ectopic Multiple Living            0     Review of Systems  Constitutional: Negative for fever.  HENT: Positive for dental problem. Negative for drooling and facial swelling.   Neurological: Positive for seizures and headaches.  All other systems reviewed and are negative.     Allergies  Amoxicillin; Monosodium  glutamate; Red dye; Toradol; and Tramadol  Home Medications   Prior to Admission medications   Medication Sig Start Date End Date Taking? Authorizing Provider  amoxicillin (AMOXIL) 500 MG capsule Take 1 capsule (500 mg total) by mouth 3 (three) times daily. 07/14/14 07/24/14 Yes Burgess Amor, PA-C  Aspirin-Salicylamide-Caffeine (BC HEADACHE) 325-95-16 MG TABS Take 1 packet by mouth every 4 (four) hours as needed.   Yes Historical Provider, MD  citalopram (CELEXA) 20 MG tablet Take 20 mg by mouth daily. 01/24/14  Yes Historical Provider, MD  cloNIDine (CATAPRES) 0.1 MG tablet Take 0.1 mg by mouth at bedtime.   Yes Historical Provider, MD  ibuprofen (ADVIL,MOTRIN) 200 MG tablet Take 800 mg by mouth every 6 (six) hours as needed.   Yes Historical Provider, MD  acetaminophen-codeine (TYLENOL #3) 300-30 MG per tablet Take 1-2 tablets by mouth every 6 (six) hours as needed. Patient not taking: Reported on 06/12/2014 02/08/14   Ivery Quale, PA-C  HYDROcodone-acetaminophen (NORCO/VICODIN) 5-325 MG per tablet Take 1 tablet by mouth every 4 (four) hours as needed. Patient not taking: Reported on 07/20/2014 07/14/14   Burgess Amor, PA-C  mupirocin ointment (BACTROBAN) 2 % Place 1 application into the nose 2 (two) times daily. Patient not taking: Reported on 07/20/2014 06/12/14   Burgess Amor, PA-C  promethazine-codeine (PHENERGAN WITH CODEINE) 6.25-10 MG/5ML syrup Take 5 mLs by mouth every 4 (four) hours as needed for cough. Patient not taking:  Reported on 06/12/2014 04/11/14   Burgess Amor, PA-C   BP 127/54 mmHg  Pulse 69  Temp(Src) 98.3 F (36.8 C) (Oral)  Resp 18  Ht 5\' 3"  (1.6 m)  Wt 142 lb (64.411 kg)  BMI 25.16 kg/m2  SpO2 100%  LMP 06/20/2014 Physical Exam  Constitutional: She is oriented to person, place, and time. She appears well-developed and well-nourished.  Non-toxic appearance.  HENT:  Head: Normocephalic.  Right Ear: Tympanic membrane and external ear normal.  Left Ear: Tympanic membrane and external  ear normal.  There are multiple dental caries noted of the upper and lower gum. There is particular tenderness about the gum and with percussion of the teeth of the first left upper molar. The airway is patent. There is no swelling under the tongue. The speech is clear and understandable, there is no trismus noted.  Eyes: EOM and lids are normal. Pupils are equal, round, and reactive to light.  Neck: Normal range of motion. Neck supple. Carotid bruit is not present.  Cardiovascular: Normal rate, regular rhythm, normal heart sounds, intact distal pulses and normal pulses.   Pulmonary/Chest: Breath sounds normal. No respiratory distress.  Abdominal: Soft. Bowel sounds are normal. There is no tenderness. There is no guarding.  Musculoskeletal: Normal range of motion.  Lymphadenopathy:       Head (right side): No submandibular adenopathy present.       Head (left side): No submandibular adenopathy present.    She has no cervical adenopathy.  Neurological: She is alert and oriented to person, place, and time. She has normal strength. No cranial nerve deficit or sensory deficit.  Skin: Skin is warm and dry.  Psychiatric: She has a normal mood and affect. Her speech is normal.  Nursing note and vitals reviewed.   ED Course  Procedures (including critical care time) Labs Review Labs Reviewed - No data to display  Imaging Review No results found.   EKG Interpretation None      MDM  Patient presents to the emergency department with complaint of dental pain. The patient was seen recently in the emergency department at which time she was treated with amoxicillin. Patient states she continues to have pain. She is scheduled for dental appointment in a week and a half. Vital signs are well within normal limits. Distal trismus noted. The airway is patent. There is no evidence for Ludwig's Angina.  Patient treated in the emergency department with Rocephin. Patient given dental resources. Patient  also given resources for local clinics to establish a primary physician to assist with her medical and dental issues.    Final diagnoses:  None    *I have reviewed nursing notes, vital signs, and all appropriate lab and imaging results for this patient.Ivery Quale, PA-C 07/20/14 1517  Samuel Jester, DO 07/23/14 [phone]

## 2014-07-20 NOTE — ED Notes (Signed)
PT c/o left upper dental pain x10 days with no relief from recent antibiotic prescription per pt.

## 2014-10-02 ENCOUNTER — Emergency Department (HOSPITAL_COMMUNITY)
Admission: EM | Admit: 2014-10-02 | Discharge: 2014-10-02 | Disposition: A | Discharge status: Home / Self Care | Payer: Self-pay | Attending: Emergency Medicine | Admitting: Emergency Medicine

## 2014-10-02 ENCOUNTER — Encounter (HOSPITAL_COMMUNITY): Payer: Self-pay | Admitting: Emergency Medicine

## 2014-10-02 DIAGNOSIS — Z87442 Personal history of urinary calculi: Secondary | ICD-10-CM | POA: Insufficient documentation

## 2014-10-02 DIAGNOSIS — Z79899 Other long term (current) drug therapy: Secondary | ICD-10-CM | POA: Insufficient documentation

## 2014-10-02 DIAGNOSIS — K0889 Other specified disorders of teeth and supporting structures: Secondary | ICD-10-CM

## 2014-10-02 DIAGNOSIS — Z8742 Personal history of other diseases of the female genital tract: Secondary | ICD-10-CM | POA: Insufficient documentation

## 2014-10-02 DIAGNOSIS — Z8614 Personal history of Methicillin resistant Staphylococcus aureus infection: Secondary | ICD-10-CM | POA: Insufficient documentation

## 2014-10-02 DIAGNOSIS — Z87891 Personal history of nicotine dependence: Secondary | ICD-10-CM | POA: Insufficient documentation

## 2014-10-02 DIAGNOSIS — Z880 Allergy status to penicillin: Secondary | ICD-10-CM | POA: Insufficient documentation

## 2014-10-02 DIAGNOSIS — K088 Other specified disorders of teeth and supporting structures: Secondary | ICD-10-CM | POA: Insufficient documentation

## 2014-10-02 DIAGNOSIS — Z8679 Personal history of other diseases of the circulatory system: Secondary | ICD-10-CM | POA: Insufficient documentation

## 2014-10-02 MED ORDER — AMOXICILLIN 500 MG PO CAPS: 500.0000 mg | ORAL_CAPSULE | Freq: Two times a day (BID) | ORAL | Status: AC

## 2014-10-02 NOTE — ED Notes (Signed)
Patient c/o right lower dental pain. Per patient broken tooth with dental abscess. Patient reports abscess started draining yesterday. Per patient taken [facility] and ibuprofen with no relief.

## 2014-10-02 NOTE — ED Provider Notes (Signed)
CSN: [id number]     Arrival date & time 10/02/14  1205 History   First MD Initiated Contact with Patient 10/02/14 1213     Chief Complaint  Patient presents with  . Dental Pain    Patient is a 24 y.o. female presenting with tooth pain. The history is provided by the patient.  Dental Pain Location:  Lower Severity:  Moderate Onset quality:  Gradual Duration:  1 day Timing:  Constant Progression:  Worsening Chronicity:  Recurrent Relieved by:  Nothing Worsened by:  Jaw movement Associated symptoms: no fever     Past Medical History  Diagnosis Date  . Migraines   . Seizures   . PID (acute pelvic inflammatory disease)   . Ovarian cyst   . Splenic mass   . MRSA (methicillin resistant staph aureus) culture positive   . Kidney stones    History reviewed. No pertinent past surgical history. Family History  Problem Relation Age of Onset  . Diabetes Father    Social History  Substance Use Topics  . Smoking status: Former Smoker -- 0.50 packs/day for 5 years    Types: Cigarettes    Quit date: 08/12/2011  . Smokeless tobacco: Never Used  . Alcohol Use: No   OB History    Gravida Para Term Preterm AB TAB SAB Ectopic Multiple Living            0     Review of Systems  Constitutional: Negative for fever.  Gastrointestinal: Negative for vomiting.      Allergies  Amoxicillin; Monosodium glutamate; Red dye; Toradol; and Tramadol  Home Medications   Prior to Admission medications   Medication Sig Start Date End Date Taking? Authorizing Provider  amoxicillin (AMOXIL) 500 MG capsule Take 1 capsule (500 mg total) by mouth 2 (two) times daily. 10/02/14   Zadie Rhine, MD  citalopram (CELEXA) 20 MG tablet Take 20 mg by mouth daily. 01/24/14   Historical Provider, MD  cloNIDine (CATAPRES) 0.1 MG tablet Take 0.1 mg by mouth at bedtime.    Historical Provider, MD  ibuprofen (ADVIL,MOTRIN) 200 MG tablet Take 800 mg by mouth every 6 (six) hours as needed.    Historical  Provider, MD   BP 117/64 mmHg  Pulse 89  Temp(Src) 98.1 F (36.7 C) (Oral)  Resp 18  Ht  (1.6 m)  Wt 150 lb (68.04 kg)  BMI 26.58 kg/m2  SpO2 100%  LMP 09/14/2014 Physical Exam CONSTITUTIONAL: Well developed/well nourished HEAD AND FACE: Normocephalic/atraumatic EYES: EOMI ENMT: Mucous membranes moist.  Poor dentition.  No trismus.  No focal abscess noted. NECK: supple no meningeal signs CV: S1/S2 noted, no murmurs/rubs/gallops noted LUNGS: Lungs are clear to auscultation bilaterally, no apparent distress ABDOMEN: soft, nontender, no rebound or guarding NEURO: Pt is awake/alert, moves all extremitiesx4 EXTREMITIES:full ROM SKIN: warm, color normal  ED Course  Procedures pt using phone during interview Well appearing I told her to call dentist ASAP  MDM   Final diagnoses:  Pain, dental    Nursing notes including past medical history and social history reviewed and considered in documentation     Zadie Rhine, MD 10/02/14 1219

## 2014-10-02 NOTE — ED Notes (Signed)
MD at bedside. 

## 2014-10-02 NOTE — Discharge Instructions (Signed)

## 2014-10-19 ENCOUNTER — Other Ambulatory Visit (HOSPITAL_COMMUNITY)
Admission: RE | Admit: 2014-10-19 | Discharge: 2014-10-19 | Disposition: A | Discharge status: Home / Self Care | Payer: Self-pay | Source: Ambulatory Visit | Attending: Nurse Practitioner | Admitting: Nurse Practitioner

## 2014-10-19 DIAGNOSIS — E0590 Thyrotoxicosis, unspecified without thyrotoxic crisis or storm: Secondary | ICD-10-CM | POA: Insufficient documentation

## 2014-10-19 LAB — T4, FREE: Free T4: 0.54 ng/dL — ABNORMAL LOW (ref 0.61–1.12)

## 2014-10-19 LAB — TSH: TSH: 0.59 u[IU]/mL (ref 0.350–4.500)

## 2014-12-13 ENCOUNTER — Emergency Department (HOSPITAL_COMMUNITY)
Admission: EM | Admit: 2014-12-13 | Discharge: 2014-12-13 | Discharge status: Against Medical Advice | Payer: Self-pay | Attending: Emergency Medicine | Admitting: Emergency Medicine

## 2014-12-13 ENCOUNTER — Encounter (HOSPITAL_COMMUNITY): Payer: Self-pay | Admitting: *Deleted

## 2014-12-13 DIAGNOSIS — G8929 Other chronic pain: Secondary | ICD-10-CM | POA: Insufficient documentation

## 2014-12-13 DIAGNOSIS — R1110 Vomiting, unspecified: Secondary | ICD-10-CM | POA: Insufficient documentation

## 2014-12-13 DIAGNOSIS — R519 Headache, unspecified: Secondary | ICD-10-CM

## 2014-12-13 DIAGNOSIS — R51 Headache: Secondary | ICD-10-CM | POA: Insufficient documentation

## 2014-12-13 HISTORY — DX: Other chronic pain: G89.29

## 2014-12-13 HISTORY — DX: Pelvic and perineal pain unspecified side: R10.20

## 2014-12-13 HISTORY — DX: Opioid abuse, uncomplicated: F11.10

## 2014-12-13 HISTORY — DX: Pelvic and perineal pain: R10.2

## 2014-12-13 NOTE — ED Notes (Signed)
Pt states she has had a headache since early this morning. Pt has had several episode of emesis.

## 2014-12-13 NOTE — ED Notes (Signed)
Pt called three times from triage with no answer 

## 2015-10-29 IMAGING — CR DG CHEST 2V
2 series · 2 of 2 positions shown · non-contrast
Comparison: August 03, 2013

CLINICAL DATA: Cough, congestion and fever since [REDACTED].

EXAM:
CHEST  2 VIEW

[view not recorded (1 of 2)]
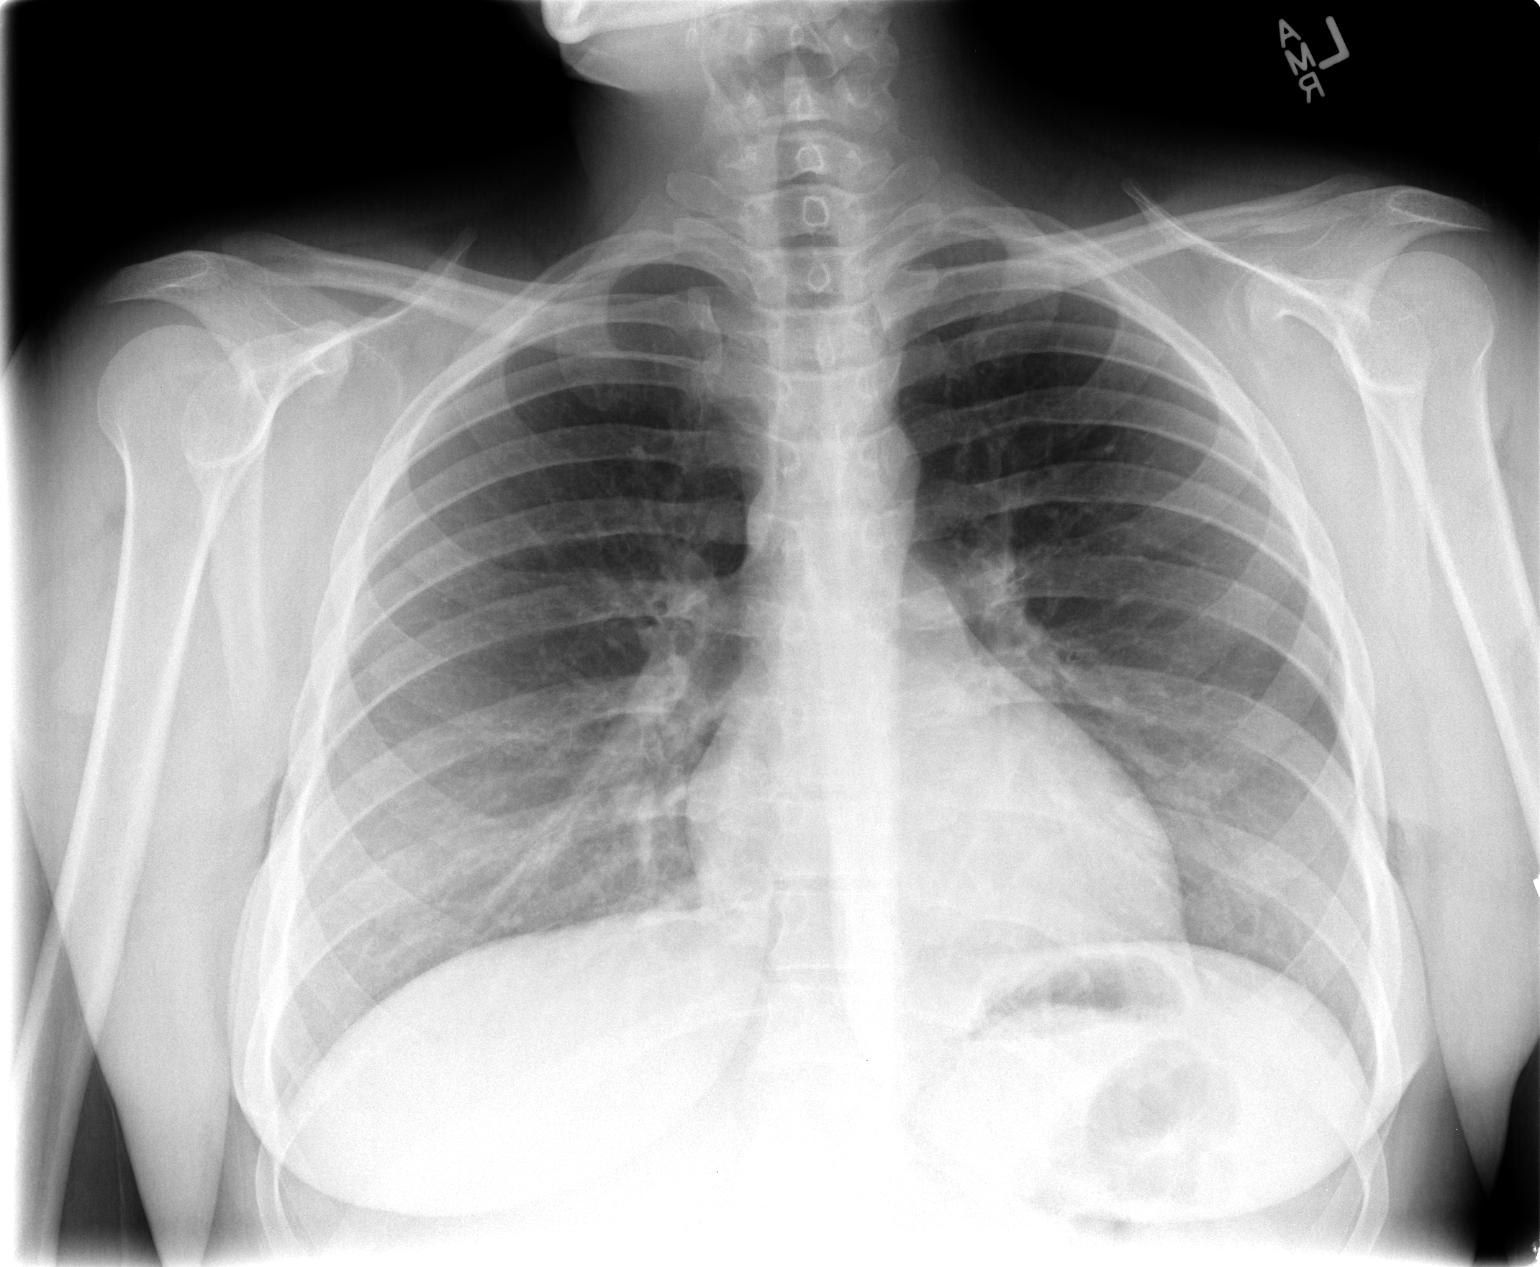

[view not recorded (2 of 2)]
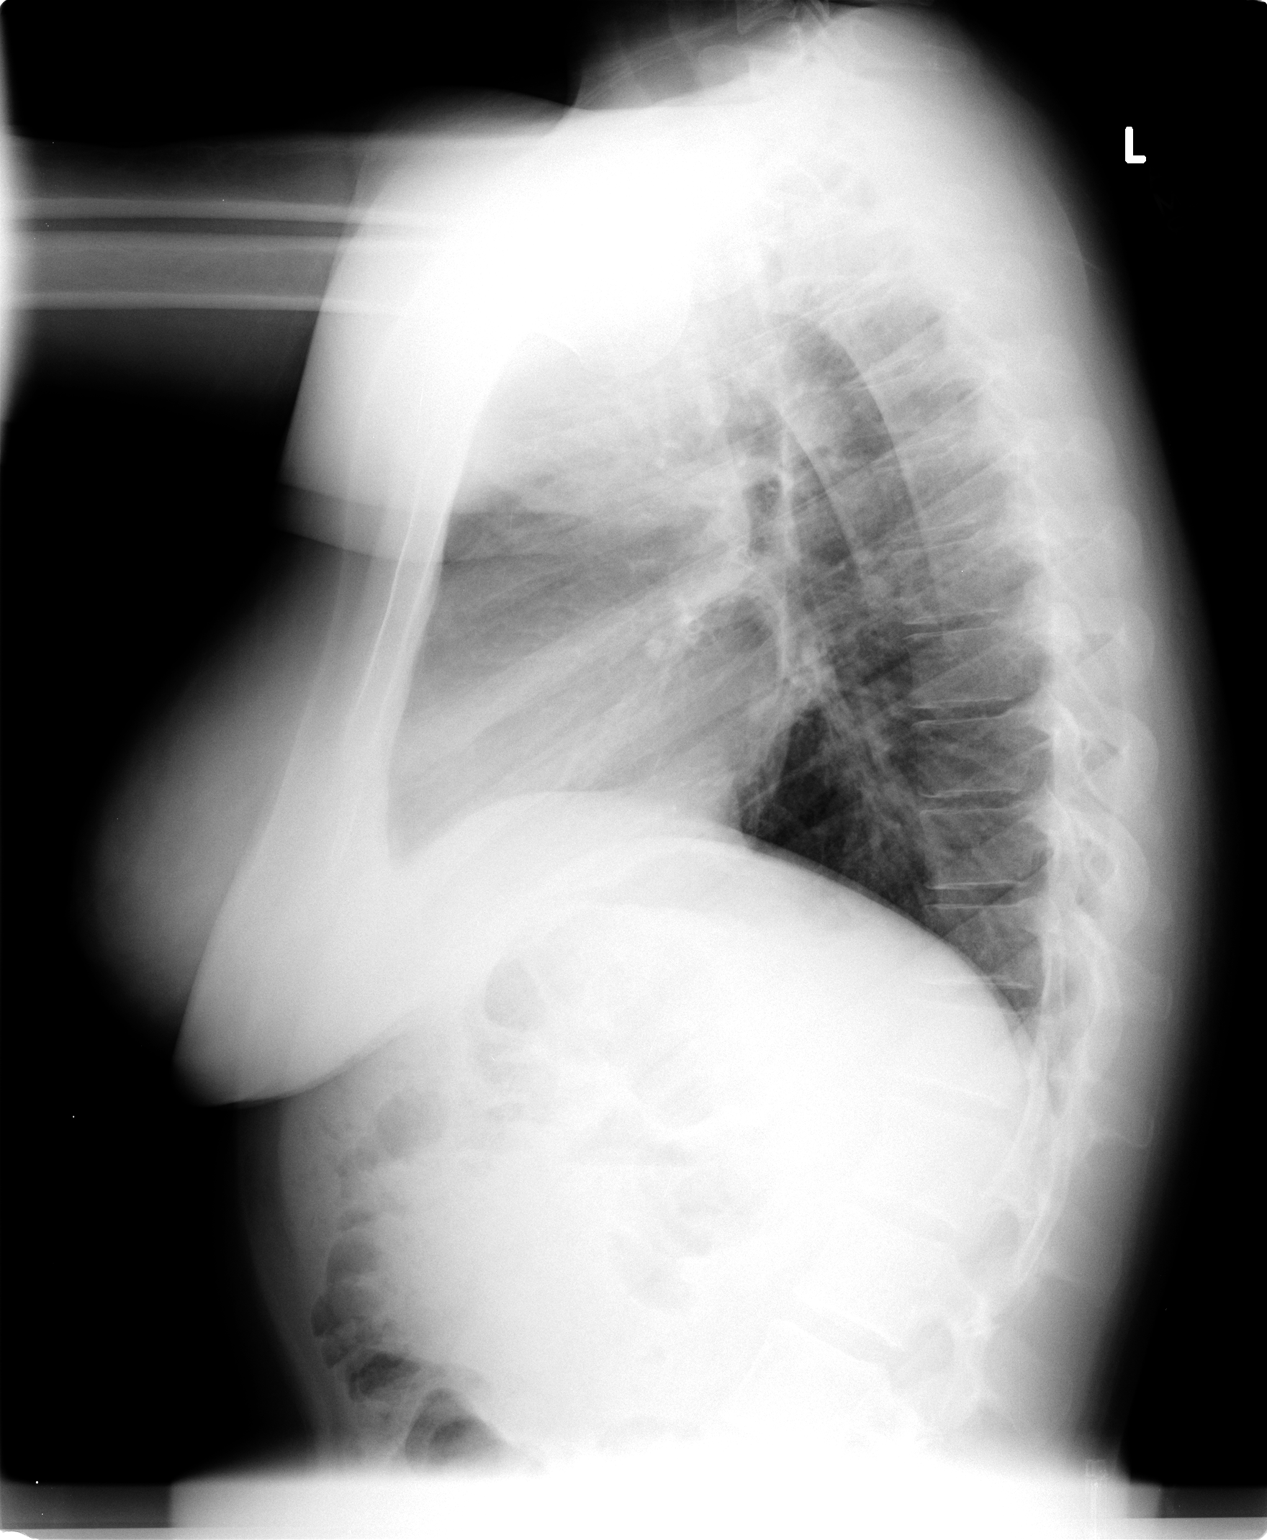

[2 of 2 positions shown; findings below may reference images not displayed]

FINDINGS: The heart size and mediastinal contours are within normal limits.
There is no focal infiltrate, pulmonary edema, or pleural effusion.
The visualized skeletal structures are unremarkable.
IMPRESSION: No active cardiopulmonary disease.
